# Patient Record
Sex: Male | Born: 1980 | Race: Black or African American | Hispanic: No | Marital: Single | Smoking: Current every day smoker
Health system: Southern US, Community
[De-identification: ages and names within clinical notes are randomized; demographics above are authoritative.]

---
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
## 2006-01-03 ENCOUNTER — Emergency Department (HOSPITAL_COMMUNITY): Admission: EM | Admit: 2006-01-03 | Discharge: 2006-01-03 | Payer: Self-pay | Admitting: Family Medicine

## 2007-06-18 ENCOUNTER — Emergency Department (HOSPITAL_COMMUNITY): Admission: EM | Admit: 2007-06-18 | Discharge: 2007-06-18 | Payer: Self-pay | Admitting: Family Medicine

## 2007-11-21 ENCOUNTER — Emergency Department (HOSPITAL_COMMUNITY): Admission: EM | Admit: 2007-11-21 | Discharge: 2007-11-21 | Payer: Self-pay | Admitting: Emergency Medicine

## 2008-06-03 ENCOUNTER — Emergency Department (HOSPITAL_COMMUNITY): Admission: EM | Admit: 2008-06-03 | Discharge: 2008-06-03 | Payer: Self-pay | Admitting: *Deleted

## 2008-06-19 ENCOUNTER — Emergency Department (HOSPITAL_COMMUNITY): Admission: EM | Admit: 2008-06-19 | Discharge: 2008-06-19 | Payer: Self-pay | Admitting: Emergency Medicine

## 2009-04-29 ENCOUNTER — Emergency Department (HOSPITAL_COMMUNITY): Admission: EM | Admit: 2009-04-29 | Discharge: 2009-04-29 | Payer: Self-pay | Admitting: Emergency Medicine

## 2009-08-10 ENCOUNTER — Emergency Department (HOSPITAL_COMMUNITY): Admission: EM | Admit: 2009-08-10 | Discharge: 2009-08-10 | Payer: Self-pay | Admitting: Family Medicine

## 2009-09-29 ENCOUNTER — Emergency Department (HOSPITAL_COMMUNITY): Admission: EM | Admit: 2009-09-29 | Discharge: 2009-09-30 | Payer: Self-pay | Admitting: Internal Medicine

## 2010-02-28 ENCOUNTER — Emergency Department (HOSPITAL_COMMUNITY): Admission: EM | Admit: 2010-02-28 | Discharge: 2010-02-28 | Payer: Self-pay | Admitting: Family Medicine

## 2010-09-04 ENCOUNTER — Emergency Department (HOSPITAL_COMMUNITY)
Admission: EM | Admit: 2010-09-04 | Discharge: 2010-09-04 | Disposition: A | Discharge status: Home / Self Care | Payer: Self-pay | Attending: Emergency Medicine | Admitting: Emergency Medicine

## 2010-09-04 DIAGNOSIS — IMO0002 Reserved for concepts with insufficient information to code with codable children: Secondary | ICD-10-CM | POA: Insufficient documentation

## 2010-09-04 DIAGNOSIS — M7989 Other specified soft tissue disorders: Secondary | ICD-10-CM | POA: Insufficient documentation

## 2011-05-08 LAB — POCT CARDIAC MARKERS
CKMB, poc: 3.6
Myoglobin, poc: 179
Operator id: 270111
Troponin i, poc: <0.05

## 2011-05-08 LAB — CBC
HCT: 49
Hemoglobin: 16.1
MCHC: 32.8
MCV: 87.3
Platelets: 267
RBC: 5.61
RDW: 12.8
WBC: 10.6 — ABNORMAL HIGH

## 2011-05-08 LAB — DIFFERENTIAL
Basophils Relative: 1
Lymphocytes Relative: 7 — ABNORMAL LOW
Lymphs Abs: 0.7
Monocytes Absolute: 0.5
Monocytes Relative: 4
Neutro Abs: 9.4 — ABNORMAL HIGH

## 2011-05-08 LAB — COMPREHENSIVE METABOLIC PANEL
Albumin: 4.3
Alkaline Phosphatase: 88
BUN: 13
Potassium: 3.9
Total Protein: 7.7

## 2011-11-11 ENCOUNTER — Encounter (HOSPITAL_COMMUNITY): Payer: Self-pay | Admitting: *Deleted

## 2011-11-11 ENCOUNTER — Emergency Department (HOSPITAL_COMMUNITY)
Admission: EM | Admit: 2011-11-11 | Discharge: 2011-11-11 | Disposition: A | Discharge status: Home / Self Care | Payer: Self-pay | Attending: Emergency Medicine | Admitting: Emergency Medicine

## 2011-11-11 DIAGNOSIS — F17200 Nicotine dependence, unspecified, uncomplicated: Secondary | ICD-10-CM | POA: Insufficient documentation

## 2011-11-11 DIAGNOSIS — G8929 Other chronic pain: Secondary | ICD-10-CM

## 2011-11-11 DIAGNOSIS — K089 Disorder of teeth and supporting structures, unspecified: Secondary | ICD-10-CM | POA: Insufficient documentation

## 2011-11-11 MED ORDER — OXYCODONE-ACETAMINOPHEN 5-325 MG PO TABS: 1.0000 | ORAL_TABLET | Freq: Four times a day (QID) | ORAL | Status: DC | PRN

## 2011-11-11 MED ORDER — PENICILLIN V POTASSIUM 500 MG PO TABS: 500.0000 mg | ORAL_TABLET | Freq: Four times a day (QID) | ORAL | Status: DC

## 2011-11-11 MED ORDER — OXYCODONE-ACETAMINOPHEN 5-325 MG PO TABS
1.0000 | ORAL_TABLET | Freq: Once | ORAL | Status: AC
  Administered 2011-11-11: 1 via ORAL
  Filled 2011-11-11: qty 1

## 2011-11-11 NOTE — ED Notes (Signed)
Patient has had right sided tooth ache for 3 weeks. Pt states that he had a left over antibiotic that he took for same.  Pt states that he has had increase in pain and now has swelling. PT states that he is supposed to have teeth removed.

## 2011-11-11 NOTE — ED Provider Notes (Signed)
Medical screening examination/treatment/procedure(s) were performed by non-physician practitioner and as supervising physician I was immediately available for consultation/collaboration.   Vida Roller, MD 11/11/11 [phone]

## 2011-11-11 NOTE — Discharge Instructions (Signed)
You have a dental injury. Use the resource guide listed below to help you find a dentist if you do not already have one to followup with. It is very important that you get evaluated by a dentist as soon as possible. Call tomorrow to schedule an appointment. Use your pain medication as prescribed and do not operate heavy machinery while on pain medication. Note that your pain medication contains acetaminophen (Tylenol) & its is not reccommended that you use additional acetaminophen (Tylenol) while taking this medication. Take your full course of antibiotics. Read the instructions below.  Eat a soft or liquid diet and rinse your mouth out after meals with warm water. You should see a dentist or return here at once if you have increased swelling, increased pain or uncontrolled bleeding from the site of your injury.   SEEK MEDICAL CARE IF:   You have increased pain not controlled with medicines.   You have swelling around your tooth, in your face or neck.   You have bleeding which starts, continues, or gets worse.   You have a fever >101  If you are unable to open your mouth  RESOURCE GUIDE  Chronic Pain Chronic pain can be defined as pain that is lasting, off and on, and lasts for 3 to 6 months or longer. Many things cause chronic pain, which can make it difficult to make a discrete diagnosis. There are many treatment options available for chronic pain. However, finding a treatment that works well for you may require trying various approaches until a suitable one is found. CAUSES  In some types of chronic medical conditions, the pain is caused by a normal pain response within the body. A normal pain response helps the body identify illness or injury and prevent further damage from being done. In these cases, the cause of the pain may be identified and treated, even if it may not be cured completely. Examples of chronic conditions which can cause chronic pain include:  Inflammation of the joints  (arthritis).   Back pain or neck pain (including bulging or herniated disks).   Migraine headaches.   Cancer.  In some other types of chronic pain syndromes, the pain is caused by an abnormal pain response within the body. An abnormal pain response is present when there is no ongoing cause (or stimulus) for the pain, or when the cause of the pain is arising from the nerves or nervous system itself. Examples of conditions which can cause chronic pain due to an abnormal pain response include:  Fibromyalgia.   Reflex sympathetic dystrophy (RSD).   Neuropathy (when the nerves themselves are damaged, and may cause pain).  DIAGNOSIS  Your caregiver will help diagnose your condition over time. In many cases, the initial focus will be on excluding conditions that could be causing the pain. Depending on your symptoms, your caregiver may order some tests to diagnose your condition. Some of these tests include:  Blood tests.   Computerized X-ray scans (CT scan).   Computerized magnetic scans (MRI).   X-rays.   Ultrasounds.   Nerve conduction studies.   Consultation with other physicians or specialists.  TREATMENT  There are many treatment options for people suffering from chronic pain. Finding a treatment that works well may take time.   You may be referred to a pain management specialist.   You may be put on medication to help with the pain. Unfortunately, some medications (such as opiate medications) may not be very effective in cases where chronic  pain is due to abnormal pain responses. Finding the right medications can take some time.   Adjunctive therapies may be used to provide additional relief and improve a patient's quality of life. These therapies include:   Mindfulness meditation.   Acupuncture.   Biofeedback.   Cognitive-behavioral therapy.   In certain cases, surgical interventions may be attempted.  HOME CARE INSTRUCTIONS   Make sure you understand these  instructions prior to discharge.   Ask any questions and share any further concerns you have with your caregiver prior to discharge.   Take all medications as directed by your caregiver.   Keep all follow-up appointments.  SEEK MEDICAL CARE IF:   Your pain gets worse.   You develop a new pain that was not present before.   You cannot tolerate any medications prescribed by your caregiver.   You develop new symptoms since your last visit with your caregiver.  SEEK IMMEDIATE MEDICAL CARE IF:   You develop muscular weakness.   You have decreased sensation or numbness.   You lose control of bowel or bladder function.   Your pain suddenly gets much worse.   You have an oral temperature above 102 F (38.9 C), not controlled by medication.   You develop shaking chills, confusion, chest pain, or shortness of breath.  Document Released: 04/07/2002 Document Revised: 07/05/2011 Document Reviewed: 07/14/2008 [facility] Patient Information 2012 [place], [place].  Dental Problems  Patients with Medicaid: [facility] [phone] [address]                                           [phone] [address] Phone:  [phone]                                                  Phone:  [phone]  If unable to pay or uninsured, contact:  [facility] or [facility]. to become qualified for the adult dental clinic.  Chronic Pain Problems Contact [facility]  [phone] Patients need to be referred by their primary care doctor.  Insufficient Money for Medicine Contact [employer]:  call "211" or [facility] 4425812676.  No Primary Care Doctor Call [facility]  [phone] Other agencies that provide inexpensive medical care    [facility]  [phone]    [facility]  [phone]    [facility]  [phone]    [facility]  [phone]    [facility]  [phone]     [facility]  [phone]  Psychological Services [facility] Behavioral Health  [phone] [facility] Services  [phone] [facility]   [phone] (emergency services [phone])  Substance Abuse Resources [facility]  [phone] [facility] 703-637-8595 [facility] [place] [phone] Floydene Flock [phone] Residential & Outpatient Substance Abuse Program  4182663301  Abuse/Neglect [facility] Child Abuse Hotline [phone] [facility] Child Abuse Hotline [phone] (After Hours)  Emergency Shelter [facility] [phone]  M[facility] [place] [facility] 4086318785 [facility] Services 4035820286  MRSA Hotline #:   (346) 555-2285    [facility] Resources  [facility] [place]     [employer]                          [facility] Dept. [address] [place]                       [address]      [address]  Blondell Reveal Phone:  [phone]                                   Phone:  [phone]                 Phone:  [phone]  [facility] Phone:  [phone]  [facility] Child Abuse Hotline [phone] (After Hours)

## 2011-11-11 NOTE — ED Provider Notes (Signed)
History     CSN: [id number]  Arrival date & time 11/11/11  4098   First MD Initiated Contact with Patient 11/11/11 [phone]      Chief Complaint  Patient presents with  . Dental Pain    (Consider location/radiation/quality/duration/timing/severity/associated sxs/prior treatment) HPI Comments: Patient presents to the emergency department with a dental complaint. Symptoms began 2-3 weeks ago. The patient has tried to alleviate pain with NSAIDS.  Pain rated at a 10/10, characterized as throbbing in nature and located upper right mouth. Patient denies fever, night sweats, chills, difficulty swallowing or opening mouth, SOB, nuchal rigidity or decreased ROM of neck.  Patient does not have a dentist and requests a resource guide at discharge.   Patient is a 31 y.o. male presenting with tooth pain. The history is provided by the patient.  Dental PainThe primary symptoms include headaches. Primary symptoms do not include fever, shortness of breath, sore throat or cough.  The headache is not associated with photophobia, eye pain, neck stiffness or weakness.  Additional symptoms do not include: facial swelling, trouble swallowing, drooling, ear pain and fatigue.    History reviewed. No pertinent past medical history.  History reviewed. No pertinent past surgical history.  History reviewed. No pertinent family history.  History  Substance Use Topics  . Smoking status: Current Everyday Smoker    Types: Cigars  . Smokeless tobacco: Not on file  . Alcohol Use: Yes      Review of Systems  Constitutional: Negative for fever, chills, diaphoresis, activity change and fatigue.  HENT: Positive for dental problem. Negative for ear pain, congestion, sore throat, facial swelling, drooling, mouth sores, trouble swallowing, neck pain, neck stiffness, voice change, sinus pressure and tinnitus.   Eyes: Negative for photophobia, pain, redness and visual disturbance.  Respiratory: Negative for cough,  shortness of breath, wheezing and stridor.   Cardiovascular: Negative for chest pain.  Gastrointestinal: Negative for nausea, vomiting and abdominal pain.  Musculoskeletal: Negative for myalgias and gait problem.  Skin: Negative for rash.  Neurological: Positive for headaches. Negative for dizziness, syncope, speech difficulty, weakness, light-headedness and numbness.       No bowel or bladder incontinence.  Hematological: Negative for adenopathy.  Psychiatric/Behavioral: Negative for confusion.  All other systems reviewed and are negative.    Allergies  Review of patient's allergies indicates no known allergies.  Home Medications  No current outpatient prescriptions on file.  BP 139/98  Pulse 78  Temp(Src) 97.8 F (36.6 C) (Oral)  Resp 20  SpO2 95%  Physical Exam  Nursing note and vitals reviewed. Constitutional: He is oriented to person, place, and time. He appears well-developed and well-nourished. No distress.  HENT:  Head: Normocephalic and atraumatic. No trismus in the jaw.  Mouth/Throat: Uvula is midline, oropharynx is clear and moist and mucous membranes are normal. Abnormal dentition. No dental abscesses or uvula swelling. No oropharyngeal exudate, posterior oropharyngeal edema, posterior oropharyngeal erythema or tonsillar abscesses.       Poor dental hygiene. Pt able to open and close mouth with out difficulty. Airway intact. Uvula midline. Mild gingival swelling with tenderness over affected area, but no fluctuance. No swelling or tenderness of submental and submandibular regions.  Eyes: Conjunctivae and EOM are normal.  Neck: Normal range of motion and full passive range of motion without pain. Neck supple.  Cardiovascular: Normal rate and regular rhythm.   Pulmonary/Chest: Effort normal and breath sounds normal. No stridor. No respiratory distress. He has no wheezes.  Musculoskeletal: Normal range of  motion.  Lymphadenopathy:       Head (right side): No submental,  no submandibular, no tonsillar, no preauricular and no posterior auricular adenopathy present.       Head (left side): No submental, no submandibular, no tonsillar, no preauricular and no posterior auricular adenopathy present.    He has no cervical adenopathy.  Neurological: He is alert and oriented to person, place, and time.  Skin: Skin is warm and dry. No rash noted. He is not diaphoretic.    ED Course  Procedures (including critical care time)  Labs Reviewed - No data to display No results found.   No diagnosis found.    MDM  Dental pain  Patient with toothache.  No gross abscess.  Exam unconcerning for Ludwig's angina or spread of infection.  Will treat with penicillin and pain medicine.  Urged patient to follow-up with dentist. Pt was given 1 percocet here and 12 to go home.          Jaci Carrel, PA-C 11/11/11 0631  Jaci Carrel, PA-C 11/11/11 [phone]

## 2011-11-13 ENCOUNTER — Encounter (HOSPITAL_COMMUNITY): Payer: Self-pay | Admitting: Emergency Medicine

## 2011-11-13 ENCOUNTER — Emergency Department (HOSPITAL_COMMUNITY)
Admission: EM | Admit: 2011-11-13 | Discharge: 2011-11-14 | Disposition: A | Discharge status: Home / Self Care | Payer: Self-pay | Attending: Emergency Medicine | Admitting: Emergency Medicine

## 2011-11-13 DIAGNOSIS — K029 Dental caries, unspecified: Secondary | ICD-10-CM | POA: Insufficient documentation

## 2011-11-13 DIAGNOSIS — K0889 Other specified disorders of teeth and supporting structures: Secondary | ICD-10-CM

## 2011-11-13 DIAGNOSIS — F17200 Nicotine dependence, unspecified, uncomplicated: Secondary | ICD-10-CM | POA: Insufficient documentation

## 2011-11-13 NOTE — ED Notes (Signed)
PT. REPORTS PERSISTENT RIGHT UPPER MOLAR PAIN  FOR 4 DAYS , SEEN HERE LAST Sunday PRESCRIBED WITH PERCOCET AND PENICILLIN WITH NO RELIEF.

## 2011-11-14 MED ORDER — OXYCODONE-ACETAMINOPHEN 5-325 MG PO TABS: 2.0000 | ORAL_TABLET | ORAL | Status: AC | PRN

## 2011-11-14 NOTE — ED Notes (Signed)
Patient with pain in upper right jaw, some swelling under right eye noted.  Pain when swallowing and right ear pain.

## 2011-11-14 NOTE — ED Provider Notes (Signed)
History     CSN: [id number]  Arrival date & time 11/13/11  2216   First MD Initiated Contact with Patient 11/14/11 0021      Chief Complaint  Patient presents with  . Dental Pain    (Consider location/radiation/quality/duration/timing/severity/associated sxs/prior treatment) HPI History provided by the patient. Right upper dental pain persistent for the last 4 days. Patient was seen here 2 days ago and given prescription for Percocet and penicillin. He did call a dentist and is scheduled followup in the morning. He has ran out of pain medicines and continues to have severe pain. No fevers or chills. No nausea or vomiting. No difficulty swallowing or breathing. He does feel a small lump on the side of his neck became worried about that as well. No drainage. No facial swelling. No difficulty opening his mouth. Pain is sharp in quality and not radiating. Moderate to severe. History reviewed. No pertinent past medical history.  History reviewed. No pertinent past surgical history.  No family history on file.  History  Substance Use Topics  . Smoking status: Current Everyday Smoker    Types: Cigars  . Smokeless tobacco: Not on file  . Alcohol Use: Yes      Review of Systems  Constitutional: Negative for fever and chills.  HENT: Positive for dental problem. Negative for neck pain and neck stiffness.   Eyes: Negative for pain.  Respiratory: Negative for shortness of breath.   Cardiovascular: Negative for chest pain.  Gastrointestinal: Negative for abdominal pain.  Genitourinary: Negative for flank pain.  Musculoskeletal: Negative for back pain.  Skin: Negative for rash.  Neurological: Negative for headaches.  All other systems reviewed and are negative.    Allergies  Review of patient's allergies indicates no known allergies.  Home Medications   Current Outpatient Rx  Name Route Sig Dispense Refill  . ACETAMINOPHEN 325 MG PO TABS Oral Take 650 mg by mouth every 6 (six)  hours as needed. For tooth pain    . OXYCODONE-ACETAMINOPHEN 5-325 MG PO TABS Oral Take 1 tablet by mouth every 6 (six) hours as needed. For pain    . PENICILLIN V POTASSIUM 500 MG PO TABS Oral Take 500 mg by mouth 4 (four) times daily.      BP 150/100  Pulse 80  Temp(Src) 98.4 F (36.9 C) (Oral)  Resp 20  SpO2 95%  Physical Exam  Constitutional: He is oriented to person, place, and time. He appears well-developed and well-nourished.  HENT:  Head: Normocephalic and atraumatic.       No trismus. Tender right upper second molar. Multiple scattered caries throughout. No gingival fluctuance or drainage. No associated facial swelling or erythema. TMs clear. Uvula midline. No oral lesions.  Eyes: Conjunctivae and EOM are normal. Pupils are equal, round, and reactive to light.  Neck: Trachea normal. Neck supple. No thyromegaly present.  Cardiovascular: Normal rate, regular rhythm, S1 normal, S2 normal and normal pulses.     No systolic murmur is present   No diastolic murmur is present  Pulses:      Radial pulses are 2+ on the right side, and 2+ on the left side.  Pulmonary/Chest: Effort normal and breath sounds normal. He has no wheezes. He has no rhonchi. He has no rales. He exhibits no tenderness.  Abdominal: Normal appearance. There is no CVA tenderness and negative Murphy's sign.  Musculoskeletal:       BLE:s Calves nontender, no cords or erythema, negative Homans sign  Neurological: He is alert  and oriented to person, place, and time. He has normal strength. No cranial nerve deficit or sensory deficit. GCS eye subscore is 4. GCS verbal subscore is 5. GCS motor subscore is 6.  Skin: Skin is warm and dry. No rash noted. He is not diaphoretic.  Psychiatric: His speech is normal.       Cooperative and appropriate    ED Course  Dental Date/Time: 11/14/2011 12:59 AM Performed by: Sunnie Nielsen Authorized by: Sunnie Nielsen Consent: Verbal consent obtained. Risks and benefits: risks,  benefits and alternatives were discussed Consent given by: patient Patient understanding: patient states understanding of the procedure being performed Patient consent: the patient's understanding of the procedure matches consent given Procedure consent: procedure consent matches procedure scheduled Required items: required blood products, implants, devices, and special equipment available Patient identity confirmed: verbally with patient Time out: Immediately prior to procedure a "time out" was called to verify the correct patient, procedure, equipment, support staff and site/side marked as required. Preparation: Patient was prepped and draped in the usual sterile fashion. Local anesthesia used: yes Anesthesia: local infiltration Local anesthetic: lidocaine 1% without epinephrine Anesthetic total: 2 ml Patient tolerance: Patient tolerated the procedure well with no immediate complications.   (including critical care time)     MDM   Dental pain possible abscess with persistent symptoms, has only had about 48 hours of antibiotics. Dental block as above with good pain control. Patient scheduled to see Dr. Devonne Doughty in the morning in the dental clinic.  Plan continue antibiotics, keep scheduled appointment and prescription for 6 Percocet provided for any persistent pain. Stable for discharge home        Sunnie Nielsen, MD 11/14/11 0100

## 2011-11-14 NOTE — Discharge Instructions (Signed)
Dental Pain A tooth ache may be caused by cavities (tooth decay). Cavities expose the nerve of the tooth to air and hot or cold temperatures. It may come from an infection or abscess (also called a boil or furuncle) around your tooth. It is also often caused by dental caries (tooth decay). This causes the pain you are having. DIAGNOSIS  Your caregiver can diagnose this problem by exam. TREATMENT   If caused by an infection, it may be treated with medications which kill germs (antibiotics) and pain medications as prescribed by your caregiver. Take medications as directed.   Only take over-the-counter or prescription medicines for pain, discomfort, or fever as directed by your caregiver.   Whether the tooth ache today is caused by infection or dental disease, you should see your dentist as soon as possible for further care.  SEEK MEDICAL CARE IF: The exam and treatment you received today has been provided on an emergency basis only. This is not a substitute for complete medical or dental care. If your problem worsens or new problems (symptoms) appear, and you are unable to meet with your dentist, call or return to this location. SEEK IMMEDIATE MEDICAL CARE IF:   You have a fever.   You develop redness and swelling of your face, jaw, or neck.   You are unable to open your mouth.   You have severe pain uncontrolled by pain medicine.  MAKE SURE YOU:   Understand these instructions.   Will watch your condition.   Will get help right away if you are not doing well or get worse.    Dental Care and Dentist Visits Dental care supports good overall health. Regular dental visits can also help you avoid dental pain, bleeding, infection, and other more serious health problems in the future. It is important to keep the mouth healthy because diseases in the teeth, gums, and other oral tissues can spread to other areas of the body. Some problems, such as diabetes, heart disease, and pre-term labor  have been associated with poor oral health.  See your dentist every 6 months. If you experience emergency problems such as a toothache or broken tooth, go to the dentist right away. If you see your dentist regularly, you may catch problems early. It is easier to be treated for problems in the early stages.  WHAT TO EXPECT AT A DENTIST VISIT  Your dentist will look for many common oral health problems and recommend proper treatment. At your regular dental visit, you can expect:  Gentle cleaning of the teeth and gums. This includes scraping and polishing. This helps to remove the sticky substance around the teeth and gums (plaque). Plaque forms in the mouth shortly after eating. Over time, plaque hardens on the teeth as tartar. If tartar is not removed regularly, it can cause problems. Cleaning also helps remove stains.   Periodic X-rays. These pictures of the teeth and supporting bone will help your dentist assess the health of your teeth.   Periodic fluoride treatments. Fluoride is a natural mineral shown to help strengthen teeth. Fluoride treatmentinvolves applying a fluoride gel or varnish to the teeth. It is most commonly done in children.   Examination of the mouth, tongue, jaws, teeth, and gums to look for any oral health problems, such as:   Cavities (dental caries). This is decay on the tooth caused by plaque, sugar, and acid in the mouth. It is best to catch a cavity when it is small.   Inflammation of the  gums caused by plaque buildup (gingivitis).   Problems with the mouth or malformed or misaligned teeth.   Oral cancer or other diseases of the soft tissues or jaws.  KEEP YOUR TEETH AND GUMS HEALTHY For healthy teeth and gums, follow these general guidelines as well as your dentist's specific advice:  Have your teeth professionally cleaned at the dentist every 6 months.   Brush twice daily with a fluoride toothpaste.   Floss your teeth daily.   Ask your dentist if you need  fluoride supplements, treatments, or fluoride toothpaste.   Eat a healthy diet. Reduce foods and drinks with added sugar.   Avoid smoking.  TREATMENT FOR ORAL HEALTH PROBLEMS If you have oral health problems, treatment varies depending on the conditions present in your teeth and gums.  Your caregiver will most likely recommend good oral hygiene at each visit.   For cavities, gingivitis, or other oral health disease, your caregiver will perform a procedure to treat the problem. This is typically done at a separate appointment. Sometimes your caregiver will refer you to another dental specialist for specific tooth problems or for surgery.  SEEK IMMEDIATE DENTAL CARE IF:  You have pain, bleeding, or soreness in the gum, tooth, jaw, or mouth area.   A permanent tooth becomes loose or separated from the gum socket.   You experience a blow or injury to the mouth or jaw area.

## 2013-10-05 ENCOUNTER — Emergency Department (INDEPENDENT_AMBULATORY_CARE_PROVIDER_SITE_OTHER)
Admission: EM | Admit: 2013-10-05 | Discharge: 2013-10-05 | Disposition: A | Discharge status: Home / Self Care | Payer: Self-pay | Source: Home / Self Care | Attending: Family Medicine | Admitting: Family Medicine

## 2013-10-05 ENCOUNTER — Encounter (HOSPITAL_COMMUNITY): Payer: Self-pay | Admitting: Emergency Medicine

## 2013-10-05 DIAGNOSIS — R21 Rash and other nonspecific skin eruption: Secondary | ICD-10-CM

## 2013-10-05 DIAGNOSIS — B354 Tinea corporis: Secondary | ICD-10-CM

## 2013-10-05 MED ORDER — CEPHALEXIN 500 MG PO CAPS: 1000.0000 mg | ORAL_CAPSULE | Freq: Three times a day (TID) | ORAL | Status: DC

## 2013-10-05 MED ORDER — TERBINAFINE HCL 250 MG PO TABS: 250.0000 mg | ORAL_TABLET | Freq: Every day | ORAL | Status: DC

## 2013-10-05 NOTE — ED Provider Notes (Signed)
Medical screening examination/treatment/procedure(s) were performed by resident physician or non-physician practitioner and as supervising physician I was immediately available for consultation/collaboration.   Lirio Bach DOUGLAS MD.   Ryka Beighley D Danean Marner, MD 10/05/13 2056 

## 2013-10-05 NOTE — ED Provider Notes (Signed)
CSN: [id number]     Arrival date & time 10/05/13  1400 History   First MD Initiated Contact with Patient 10/05/13 1556     Chief Complaint  Patient presents with  . Rash   (Consider location/radiation/quality/duration/timing/severity/associated sxs/prior Treatment) HPI Comments: 33 year old male presents complaining of a rash for many weeks. This started on his right hip and has since spread to his left thigh and right shoulder. He assumes that it was ringworm and was putting Tinactin and Lotrimin on it consistently, these have not helped. The rash on his left thigh has started to crust and scab over. He denies any symptoms apart from the rash. He has no fever, chills, NVD, chest pain, shortness of breath. He does not feel sick. He has no close contact with anybody else a similar rash that he knows of.  Patient is a 33 y.o. male presenting with rash.  Rash Associated symptoms: no abdominal pain, no diarrhea, no fatigue, no fever, no joint pain, no myalgias, no nausea, no shortness of breath, no sore throat and not vomiting     History reviewed. No pertinent past medical history. History reviewed. No pertinent past surgical history. No family history on file. History  Substance Use Topics  . Smoking status: Current Every Day Smoker    Types: Cigars  . Smokeless tobacco: Not on file  . Alcohol Use: Yes    Review of Systems  Constitutional: Negative for fever, chills and fatigue.  HENT: Negative for sore throat.   Eyes: Negative for visual disturbance.  Respiratory: Negative for cough and shortness of breath.   Cardiovascular: Negative for chest pain, palpitations and leg swelling.  Gastrointestinal: Negative for nausea, vomiting, abdominal pain, diarrhea and constipation.  Genitourinary: Negative for dysuria, urgency, frequency and hematuria.  Musculoskeletal: Negative for arthralgias, myalgias, neck pain and neck stiffness.  Skin: Positive for rash.  Neurological: Negative for  dizziness, weakness and light-headedness.    Allergies  Review of patient's allergies indicates no known allergies.  Home Medications   Current Outpatient Rx  Name  Route  Sig  Dispense  Refill  . clotrimazole (LOTRIMIN) 1 % cream   Topical   Apply 1 application topically 2 (two) times daily.         . Tolnaftate (TINACTIN EX)   Apply externally   Apply topically.         Marland Kitchen acetaminophen (TYLENOL) 325 MG tablet   Oral   Take 650 mg by mouth every 6 (six) hours as needed. For tooth pain         . cephALEXin (KEFLEX) 500 MG capsule   Oral   Take 2 capsules (1,000 mg total) by mouth 3 (three) times daily.   42 capsule   0   . terbinafine (LAMISIL) 250 MG tablet   Oral   Take 1 tablet (250 mg total) by mouth daily.   14 tablet   0    BP 132/83  Pulse 58  Temp(Src) 98.1 F (36.7 C) (Oral)  Resp 18  SpO2 99% Physical Exam  Nursing note and vitals reviewed. Constitutional: He is oriented to person, place, and time. He appears well-developed and well-nourished. No distress.  HENT:  Head: Normocephalic.  Pulmonary/Chest: Effort normal. No respiratory distress.  Neurological: He is alert and oriented to person, place, and time. Coordination normal.  Skin: Skin is warm and dry. Rash (left lateral thigh: Circumscribed scabbed indurated rash. Left upper back and left hip: Circumscribed rash with a raised rolled border and central  clearing, with a slight scale) noted. He is not diaphoretic.  Psychiatric: He has a normal mood and affect. Judgment normal.    ED Course  Procedures (including critical care time) Labs Review Labs Reviewed - No data to display Imaging Review No results found.   MDM   1. Tinea corporis   2. Rash    I believe this patient has tinea corporis, but the rash on the left leg appears secondarily infected. He has been using topical products and has had treatment failure, treating with oral Lamisil for 2 weeks once daily. We'll treat these  secondarily infected rash with Keflex. Followup if not improving  Meds ordered this encounter  Medications  . Tolnaftate (TINACTIN EX)    Sig: Apply topically.  . clotrimazole (LOTRIMIN) 1 % cream    Sig: Apply 1 application topically 2 (two) times daily.  Marland Kitchen. terbinafine (LAMISIL) 250 MG tablet    Sig: Take 1 tablet (250 mg total) by mouth daily.    Dispense:  14 tablet    Refill:  0    Order Specific Question:  Supervising Provider    Answer:  Lorenz CoasterKELLER, DAVID C [id number]]  . cephALEXin (KEFLEX) 500 MG capsule    Sig: Take 2 capsules (1,000 mg total) by mouth 3 (three) times daily.    Dispense:  42 capsule    Refill:  0    Order Specific Question:  Supervising Provider    Answer:  Lorenz CoasterKELLER, DAVID C [[id number]]     Graylon GoodZachary H Zona Pedro, PA-C 10/05/13 1616

## 2013-10-05 NOTE — ED Notes (Signed)
Call from [facility], problem w [id number] , asking for telephone confirmation of rx

## 2013-10-05 NOTE — Discharge Instructions (Signed)
Body Ringworm Ringworm (tinea corporis) is a fungal infection of the skin on the body. This infection is not caused by worms, but is actually caused by a fungus. Fungus normally lives on the top of your skin and can be useful. However, in the case of ringworms, the fungus grows out of control and causes a skin infection. It can involve any area of skin on the body and can spread easily from one person to another (contagious). Ringworm is a common problem for children, but it can affect adults as well. Ringworm is also often found in athletes, especially wrestlers who share equipment and mats.  CAUSES  Ringworm of the body is caused by a fungus called dermatophyte. It can spread by:  Touchingother people who are infected.  Touchinginfected pets.  Touching or sharingobjects that have been in contact with the infected person or pet (hats, combs, towels, clothing, sports equipment). SYMPTOMS   Itchy, raised red spots and bumps on the skin.  Ring-shaped rash.  Redness near the border of the rash with a clear center.  Dry and scaly skin on or around the rash. Not every person develops a ring-shaped rash. Some develop only the red, scaly patches. DIAGNOSIS  Most often, ringworm can be diagnosed by performing a skin exam. Your caregiver may choose to take a skin scraping from the affected area. The sample will be examined under the microscope to see if the fungus is present.  TREATMENT  Body ringworm may be treated with a topical antifungal cream or ointment. Sometimes, an antifungal shampoo that can be used on your body is prescribed. You may be prescribed antifungal medicines to take by mouth if your ringworm is severe, keeps coming back, or lasts a long time.  HOME CARE INSTRUCTIONS   Only take over-the-counter or prescription medicines as directed by your caregiver.  Wash the infected area and dry it completely before applying yourcream or ointment.  When using antifungal shampoo to  treat the ringworm, leave the shampoo on the body for 3 5 minutes before rinsing.   Wear loose clothing to stop clothes from rubbing and irritating the rash.  Wash or change your bed sheets every night while you have the rash.  Have your pet treated by your veterinarian if it has the same infection. To prevent ringworm:   Practice good hygiene.  Wear sandals or shoes in public places and showers.  Do not share personal items with others.  Avoid touching red patches of skin on other people.  Avoid touching pets that have bald spots or wash your hands after doing so. SEEK MEDICAL CARE IF:   Your rash continues to spread after 7 days of treatment.  Your rash is not gone in 4 weeks.  The area around your rash becomes red, warm, tender, and swollen. Document Released: 07/13/2000 Document Revised: 04/09/2012 Document Reviewed: 01/28/2012 [facility] Patient Information 2014 [place], [place].  Rash A rash is a change in the color or texture of your skin. There are many different types of rashes. You may have other problems that accompany your rash. CAUSES   Infections.  Allergic reactions. This can include allergies to pets or foods.  Certain medicines.  Exposure to certain chemicals, soaps, or cosmetics.  Heat.  Exposure to poisonous plants.  Tumors, both cancerous and noncancerous. SYMPTOMS   Redness.  Scaly skin.  Itchy skin.  Dry or cracked skin.  Bumps.  Blisters.  Pain. DIAGNOSIS  Your caregiver may do a physical exam to determine what type  of rash you have. A skin sample (biopsy) may be taken and examined under a microscope. TREATMENT  Treatment depends on the type of rash you have. Your caregiver may prescribe certain medicines. For serious conditions, you may need to see a skin doctor (dermatologist). HOME CARE INSTRUCTIONS   Avoid the substance that caused your rash.  Do not scratch your rash. This can cause infection.  You may take cool baths to  help stop itching.  Only take over-the-counter or prescription medicines as directed by your caregiver.  Keep all follow-up appointments as directed by your caregiver. SEEK IMMEDIATE MEDICAL CARE IF:  You have increasing pain, swelling, or redness.  You have a fever.  You have new or severe symptoms.  You have body aches, diarrhea, or vomiting.  Your rash is not better after 3 days. MAKE SURE YOU:  Understand these instructions.  Will watch your condition.  Will get help right away if you are not doing well or get worse. Document Released: 07/06/2002 Document Revised: 10/08/2011 Document Reviewed: 04/30/2011 [facility] Patient Information 2014 [place].

## 2013-10-05 NOTE — ED Notes (Signed)
Patient has a flat, circular, scabbed area to left thigh.  Area itches.  When asked how it looked when it started, patient showed another area, his right hip, with similar area.  Right hip is flat, dry, scaly area.  Onset "weeks ago" per patient.  Has been using Lotrimin and tinactin cream.

## 2014-12-29 ENCOUNTER — Emergency Department (HOSPITAL_COMMUNITY): Payer: Self-pay

## 2014-12-29 ENCOUNTER — Encounter (HOSPITAL_COMMUNITY): Payer: Self-pay | Admitting: Emergency Medicine

## 2014-12-29 ENCOUNTER — Emergency Department (HOSPITAL_COMMUNITY)
Admission: EM | Admit: 2014-12-29 | Discharge: 2014-12-29 | Disposition: A | Discharge status: Home / Self Care | Payer: Self-pay | Attending: Emergency Medicine | Admitting: Emergency Medicine

## 2014-12-29 DIAGNOSIS — Y92310 Basketball court as the place of occurrence of the external cause: Secondary | ICD-10-CM | POA: Insufficient documentation

## 2014-12-29 DIAGNOSIS — Y9367 Activity, basketball: Secondary | ICD-10-CM | POA: Insufficient documentation

## 2014-12-29 DIAGNOSIS — Z720 Tobacco use: Secondary | ICD-10-CM | POA: Insufficient documentation

## 2014-12-29 DIAGNOSIS — Y998 Other external cause status: Secondary | ICD-10-CM | POA: Insufficient documentation

## 2014-12-29 DIAGNOSIS — X58XXXA Exposure to other specified factors, initial encounter: Secondary | ICD-10-CM | POA: Insufficient documentation

## 2014-12-29 DIAGNOSIS — S8391XA Sprain of unspecified site of right knee, initial encounter: Secondary | ICD-10-CM | POA: Insufficient documentation

## 2014-12-29 DIAGNOSIS — Z792 Long term (current) use of antibiotics: Secondary | ICD-10-CM | POA: Insufficient documentation

## 2014-12-29 DIAGNOSIS — Z79899 Other long term (current) drug therapy: Secondary | ICD-10-CM | POA: Insufficient documentation

## 2014-12-29 MED ORDER — IBUPROFEN 800 MG PO TABS: 800.0000 mg | ORAL_TABLET | Freq: Three times a day (TID) | ORAL | Status: DC

## 2014-12-29 NOTE — ED Provider Notes (Signed)
CSN: [id number]     Arrival date & time 12/29/14  0900 History  This chart was scribed for Trisha MangleKaren Sophia, PA-C, working with Gerhard Munchobert Lockwood, MD by Elon SpannerGarrett Cook, ED Scribe. This patient was seen in room TR05C/TR05C and the patient's care was started at 10:30 AM.    Chief Complaint  Patient presents with  . Knee Pain   The history is provided by the patient. No language interpreter was used.   HPI Comments: Chris Greene is a 34 y.o. male who presents to the Emergency Department complaining of lateral right knee pain onset two days ago with infrequent pain on the medial side with certain motions.  The patient was playing basketball 2 days ago when his knee buckled.  He continued playing but the pain worsened significantly that night.  He rates his pain with certain motions 8/10 and 0/10 with rest.  He denies history of knee conditions.  NKDA.  History reviewed. No pertinent past medical history. History reviewed. No pertinent past surgical history. No family history on file. History  Substance Use Topics  . Smoking status: Current Every Day Smoker    Types: Cigars  . Smokeless tobacco: Not on file  . Alcohol Use: Yes     Comment: socially    Review of Systems  Musculoskeletal: Positive for arthralgias.  All other systems reviewed and are negative.     Allergies  Review of patient's allergies indicates no known allergies.  Home Medications   Prior to Admission medications   Medication Sig Start Date End Date Taking? Authorizing Provider  acetaminophen (TYLENOL) 325 MG tablet Take 650 mg by mouth every 6 (six) hours as needed. For tooth pain    Historical Provider, MD  cephALEXin (KEFLEX) 500 MG capsule Take 2 capsules (1,000 mg total) by mouth 3 (three) times daily. 10/05/13   Graylon GoodZachary H Baker, PA-C  clotrimazole (LOTRIMIN) 1 % cream Apply 1 application topically 2 (two) times daily.    Historical Provider, MD  terbinafine (LAMISIL) 250 MG tablet Take 1 tablet (250 mg total) by  mouth daily. 10/05/13   Graylon GoodZachary H Baker, PA-C  Tolnaftate (TINACTIN EX) Apply topically.    Historical Provider, MD   BP 155/89 mmHg  Pulse 67  Temp(Src) 97.6 F (36.4 C) (Oral)  Resp 18  Ht 6\' 2"  (1.88 m)  Wt 280 lb (127.007 kg)  BMI 35.93 kg/m2  SpO2 100% Physical Exam  Constitutional: He is oriented to person, place, and time. He appears well-developed and well-nourished. No distress.  HENT:  Head: Normocephalic and atraumatic.  Eyes: Conjunctivae and EOM are normal.  Neck: Neck supple. No tracheal deviation present.  Cardiovascular: Normal rate.   Pulmonary/Chest: Effort normal. No respiratory distress.  Musculoskeletal: Normal range of motion.  No medial or lateral instability.  Negative drawer test.  Tender lateral mid joint line    Neurological: He is alert and oriented to person, place, and time.  Skin: Skin is warm and dry.  Psychiatric: He has a normal mood and affect. His behavior is normal.  Nursing note and vitals reviewed.   ED Course  Procedures (including critical care time)  DIAGNOSTIC STUDIES: Oxygen Saturation is 100% on RA, normal by my interpretation.    COORDINATION OF CARE:  10:35 AM Discussed treatment plan with patient at bedside.  Patient acknowledges and agrees with plan.    Labs Review Labs Reviewed - No data to display  Imaging Review No results found.   EKG Interpretation None  MDM knee immbolizer, ibuprofen Schedule to see Dr. Charlann Boxer for recheck     Final diagnoses:  Knee sprain, right, initial encounter     I personally performed the services in this documentation, which was scribed in my presence.  The recorded information has been reviewed and considered.   Barnet Pall.   I personally performed the services in this documentation, which was scribed in my presence.  The recorded information has been reviewed and considered.   Barnet Pall.   Lonia Skinner Stanhope, PA-C 12/29/14 1112  Gerhard Munch, MD 12/29/14 [phone]

## 2014-12-29 NOTE — Discharge Instructions (Signed)

## 2014-12-29 NOTE — ED Notes (Signed)
Patient stated someone had already taken 2nd set of vitals, and would not let me get another set.   [facility] in [place] states do not have second set of vitals.

## 2014-12-29 NOTE — ED Notes (Signed)
Patient states hurt R knee x 2 days ago after playing basketball and "knee buckled".   Patient states has not taken anything for pain at home.

## 2015-01-03 ENCOUNTER — Other Ambulatory Visit (HOSPITAL_COMMUNITY): Payer: Self-pay | Admitting: Sports Medicine

## 2015-01-03 DIAGNOSIS — M25561 Pain in right knee: Secondary | ICD-10-CM

## 2015-01-10 ENCOUNTER — Ambulatory Visit (HOSPITAL_COMMUNITY)
Admission: RE | Admit: 2015-01-10 | Discharge: 2015-01-10 | Disposition: A | Discharge status: Home / Self Care | Payer: Self-pay | Source: Ambulatory Visit | Attending: Sports Medicine | Admitting: Sports Medicine

## 2015-01-10 DIAGNOSIS — M25561 Pain in right knee: Secondary | ICD-10-CM | POA: Insufficient documentation

## 2015-01-10 DIAGNOSIS — M94261 Chondromalacia, right knee: Secondary | ICD-10-CM | POA: Insufficient documentation

## 2015-01-10 DIAGNOSIS — X58XXXA Exposure to other specified factors, initial encounter: Secondary | ICD-10-CM | POA: Insufficient documentation

## 2015-01-10 DIAGNOSIS — Y9367 Activity, basketball: Secondary | ICD-10-CM | POA: Insufficient documentation

## 2015-01-10 DIAGNOSIS — R937 Abnormal findings on diagnostic imaging of other parts of musculoskeletal system: Secondary | ICD-10-CM | POA: Insufficient documentation

## 2016-02-08 ENCOUNTER — Ambulatory Visit (HOSPITAL_COMMUNITY)
Admission: EM | Admit: 2016-02-08 | Discharge: 2016-02-08 | Disposition: A | Discharge status: Home / Self Care | Payer: Self-pay | Attending: Family Medicine | Admitting: Family Medicine

## 2016-02-08 ENCOUNTER — Encounter (HOSPITAL_COMMUNITY): Payer: Self-pay | Admitting: *Deleted

## 2016-02-08 ENCOUNTER — Ambulatory Visit (INDEPENDENT_AMBULATORY_CARE_PROVIDER_SITE_OTHER): Payer: Self-pay

## 2016-02-08 DIAGNOSIS — S8992XA Unspecified injury of left lower leg, initial encounter: Secondary | ICD-10-CM

## 2016-02-08 MED ORDER — OXYCODONE-ACETAMINOPHEN 7.5-325 MG PO TABS
1.0000 | ORAL_TABLET | ORAL | Status: DC | PRN
Start: 2016-02-08 — End: 2018-02-07

## 2016-02-08 NOTE — Discharge Instructions (Signed)
Cryotherapy °Cryotherapy is when you put ice on your injury. Ice helps lessen pain and puffiness (swelling) after an injury. Ice works the best when you start using it in the first 24 to 48 hours after an injury. °HOME CARE °· Put a dry or damp towel between the ice pack and your skin. °· You may press gently on the ice pack. °· Leave the ice on for no more than 10 to 20 minutes at a time. °· Check your skin after 5 minutes to make sure your skin is okay. °· Rest at least 20 minutes between ice pack uses. °· Stop using ice when your skin loses feeling (numbness). °· Do not use ice on someone who cannot tell you when it hurts. This includes small children and people with memory problems (dementia). °GET HELP RIGHT AWAY IF: °· You have white spots on your skin. °· Your skin turns blue or pale. °· Your skin feels waxy or hard. °· Your puffiness gets worse. °MAKE SURE YOU:  °· Understand these instructions. °· Will watch your condition. °· Will get help right away if you are not doing well or get worse. °  °This information is not intended to replace advice given to you by your health care provider. Make sure you discuss any questions you have with your health care provider. °  °Document Released: 01/02/2008 Document Revised: 10/08/2011 Document Reviewed: 03/08/2011 °Elsevier Interactive Patient Education ©2016 [employer] Inc[employer] ° °Knee Pain °Knee pain is a common problem. It can have many causes. The pain often goes away by following your doctor's home care instructions. Treatment for ongoing pain will depend on the cause of your pain. If your knee pain continues, more tests may be needed to diagnose your condition. Tests may include X-rays or other imaging studies of your knee. °HOME CARE °· Take medicines only as told by your doctor. °· Rest your knee and keep it raised (elevated) while you are resting. °· Do not do things that cause pain or make your pain worse. °· Avoid activities where both feet leave the ground at  the same time, such as running, jumping rope, or doing jumping jacks. °· Apply ice to the knee area: °¨ Put ice in a plastic bag. °¨ Place a towel between your skin and the bag. °¨ Leave the ice on for 20 minutes, 2-3 times a day. °· Ask your doctor if you should wear an elastic knee support. °· Sleep with a pillow under your knee. °· Lose weight if you are overweight. Being overweight can make your knee hurt more. °· Do not use any tobacco products, including cigarettes, chewing tobacco, or electronic cigarettes. If you need help quitting, ask your doctor. Smoking may slow the healing of any bone and joint problems that you may have. °GET HELP IF: °· Your knee pain does not stop, it changes, or it gets worse. °· You have a fever along with knee pain. °· Your knee gives out or locks up. °· Your knee becomes more swollen. °GET HELP RIGHT AWAY IF:  °· Your knee feels hot to the touch. °· You have chest pain or trouble breathing. °  °This information is not intended to replace advice given to you by your health care provider. Make sure you discuss any questions you have with your health care provider. °  °Document Released: 10/12/2008 Document Revised: 08/06/2014 Document Reviewed: 09/16/2013 °Elsevier Interactive Patient Education ©2016 Elsevier Inc. ° °

## 2016-02-08 NOTE — ED Provider Notes (Signed)
CSN: [id number]     Arrival date & time 02/08/16  1046 History   First MD Initiated Contact with Patient 02/08/16 1142     Chief Complaint  Patient presents with  . Knee Injury   (Consider location/radiation/quality/duration/timing/severity/associated sxs/prior Treatment) HPI History obtained from patient: Location:  Left knee Context/Duration: An basketball Sunday came down. After rebound with a player on his back and felt his knee pop. He immediately was unable to weight-bear. Is continued to have swelling of the knee.  Severity: 6  Quality: Ache Timing:       Constant     Home Treatment: Cold compresses, ibuprofen Associated symptoms:  Unable to weight-bear Family History: Hypertension    History reviewed. No pertinent past medical history. History reviewed. No pertinent past surgical history. History reviewed. No pertinent family history. Social History  Substance Use Topics  . Smoking status: Current Every Day Smoker    Types: Cigars  . Smokeless tobacco: None  . Alcohol Use: Yes     Comment: socially    Review of Systems  Denies: HEADACHE, NAUSEA, ABDOMINAL PAIN, CHEST PAIN, CONGESTION, DYSURIA, SHORTNESS OF BREATH  Allergies  Review of patient's allergies indicates no known allergies.  Home Medications   Prior to Admission medications   Medication Sig Start Date End Date Taking? Authorizing Provider  acetaminophen (TYLENOL) 325 MG tablet Take 650 mg by mouth every 6 (six) hours as needed. For tooth pain    Historical Provider, MD  cephALEXin (KEFLEX) 500 MG capsule Take 2 capsules (1,000 mg total) by mouth 3 (three) times daily. 10/05/13   Graylon GoodZachary H Baker, PA-C  clotrimazole (LOTRIMIN) 1 % cream Apply 1 application topically 2 (two) times daily.    Historical Provider, MD  ibuprofen (ADVIL,MOTRIN) 800 MG tablet Take 1 tablet (800 mg total) by mouth 3 (three) times daily. 12/29/14   Elson AreasLeslie K Sofia, PA-C  terbinafine (LAMISIL) 250 MG tablet Take 1 tablet (250 mg total)  by mouth daily. 10/05/13   Graylon GoodZachary H Baker, PA-C  Tolnaftate (TINACTIN EX) Apply topically.    Historical Provider, MD   Meds Ordered and Administered this Visit  Medications - No data to display  BP 139/71 mmHg  Pulse 60  Temp(Src) 98.5 F (36.9 C) (Oral)  Resp 12  SpO2 98% No data found.   Physical Exam NURSES NOTES AND VITAL SIGNS REVIEWED. CONSTITUTIONAL: Well developed, well nourished, no acute distress HEENT: normocephalic, atraumatic EYES: Conjunctiva normal NECK:normal ROM, supple, no adenopathy PULMONARY:No respiratory distress, normal effort ABDOMINAL: Soft, ND, NT BS+, No CVAT MUSCULOSKELETAL: Decreased range of motion of the left knee there is palpable effusion. Patient will not allow valgus and varus stresses to test stability of the knee. Does not tolerate anterior drawer sign. Weightbearing is minimal by patient's report. SKIN: warm and dry without rash PSYCHIATRIC: Mood and affect, behavior are normal  ED Course  Procedures (including critical care time)  Labs Review Labs Reviewed - No data to display  Imaging Review Dg Knee Complete 4 Views Left  02/08/2016  CLINICAL DATA:  Basketball injury.  Knee pain. EXAM: LEFT KNEE - COMPLETE 4+ VIEW COMPARISON:  None. FINDINGS: small to moderate joint effusion. No acute bony abnormality. Specifically, no fracture, subluxation, or dislocation. Soft tissues are intact. IMPRESSION: No bony abnormality.  Small to moderate joint effusion. Electronically Signed   By: Charlett NoseKevin  Dover M.D.   On: 02/08/2016 12:40    Discussed with patient prior to discharge Visual Acuity Review  Right Eye Distance:   Left  Eye Distance:   Bilateral Distance:    Right Eye Near:   Left Eye Near:    Bilateral Near:         MDM   1. Knee injury, left, initial encounter     Patient is reassured that there are no issues that require transfer to higher level of care at this time or additional tests. Patient is advised to continue home  symptomatic treatment. Patient is advised that if there are new or worsening symptoms to attend the emergency department, contact primary care provider, or return to UC. Instructions of care provided discharged home in stable condition.    THIS NOTE WAS GENERATED USING A VOICE RECOGNITION SOFTWARE PROGRAM. ALL REASONABLE EFFORTS  WERE MADE TO PROOFREAD THIS DOCUMENT FOR ACCURACY.  I have verbally reviewed the discharge instructions with the patient. A printed AVS was given to the patient.  All questions were answered prior to discharge.      Tharon Aquas, PA 02/08/16 1353

## 2016-02-08 NOTE — ED Notes (Signed)
Pt reports  l  Knee    Pain /  Injury     He    reports  He  Was playing  Basketball      When   aplayer landed  On the  Affected knee         he  Has  Pain on movement  And  On  Weight  Bearing

## 2016-09-05 IMAGING — CR DG KNEE COMPLETE 4+V*R*
4 series · 4 of 4 positions shown · non-contrast
Comparison: None.

CLINICAL DATA: 34-year-old male with right knee pain and swelling
localizing to the lateral aspect of the knee. Knee buckled 2 days
previously while playing basketball.

EXAM:
RIGHT KNEE - COMPLETE 4+ VIEW

[knee ap]
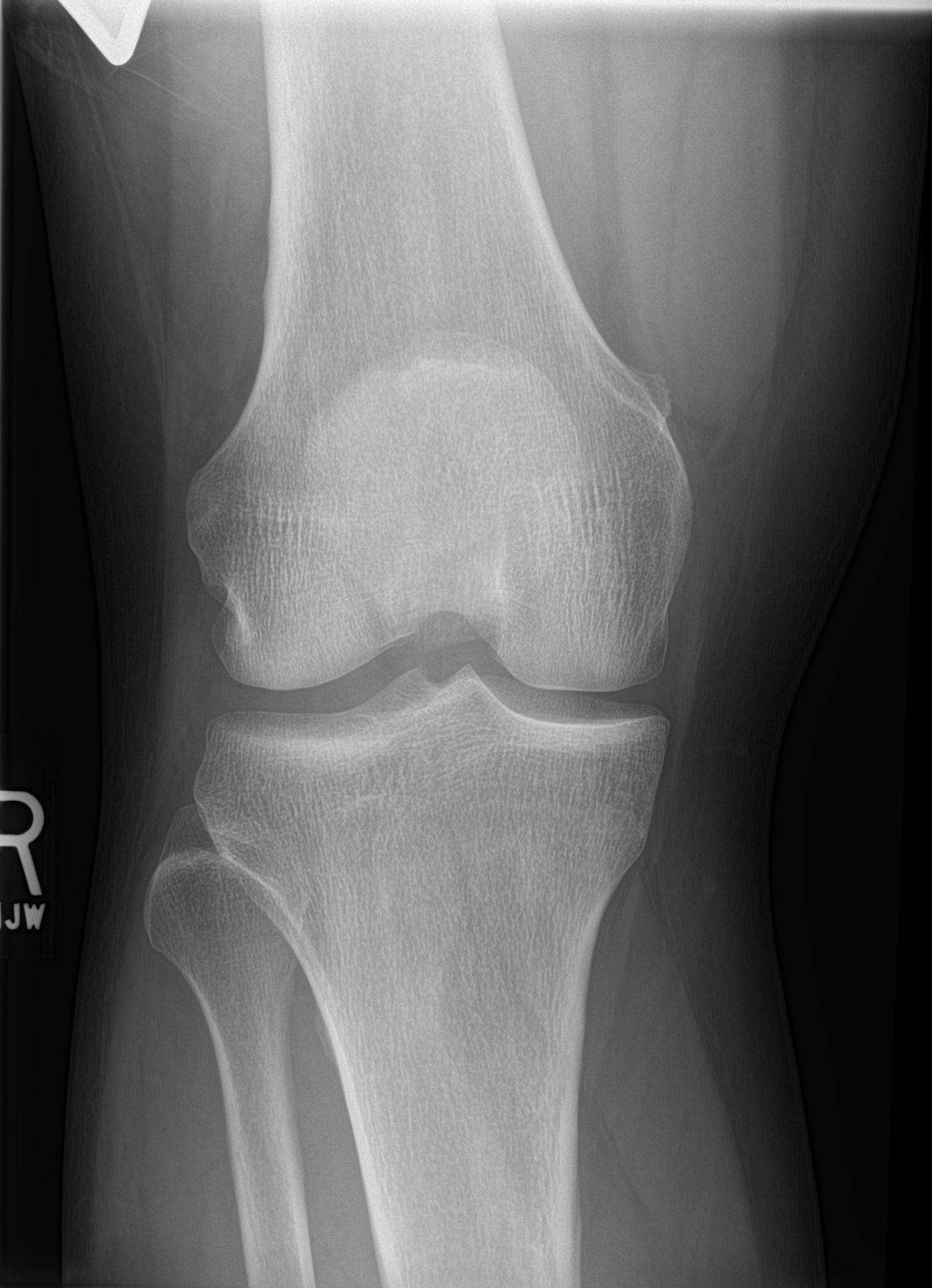

[knee lat]
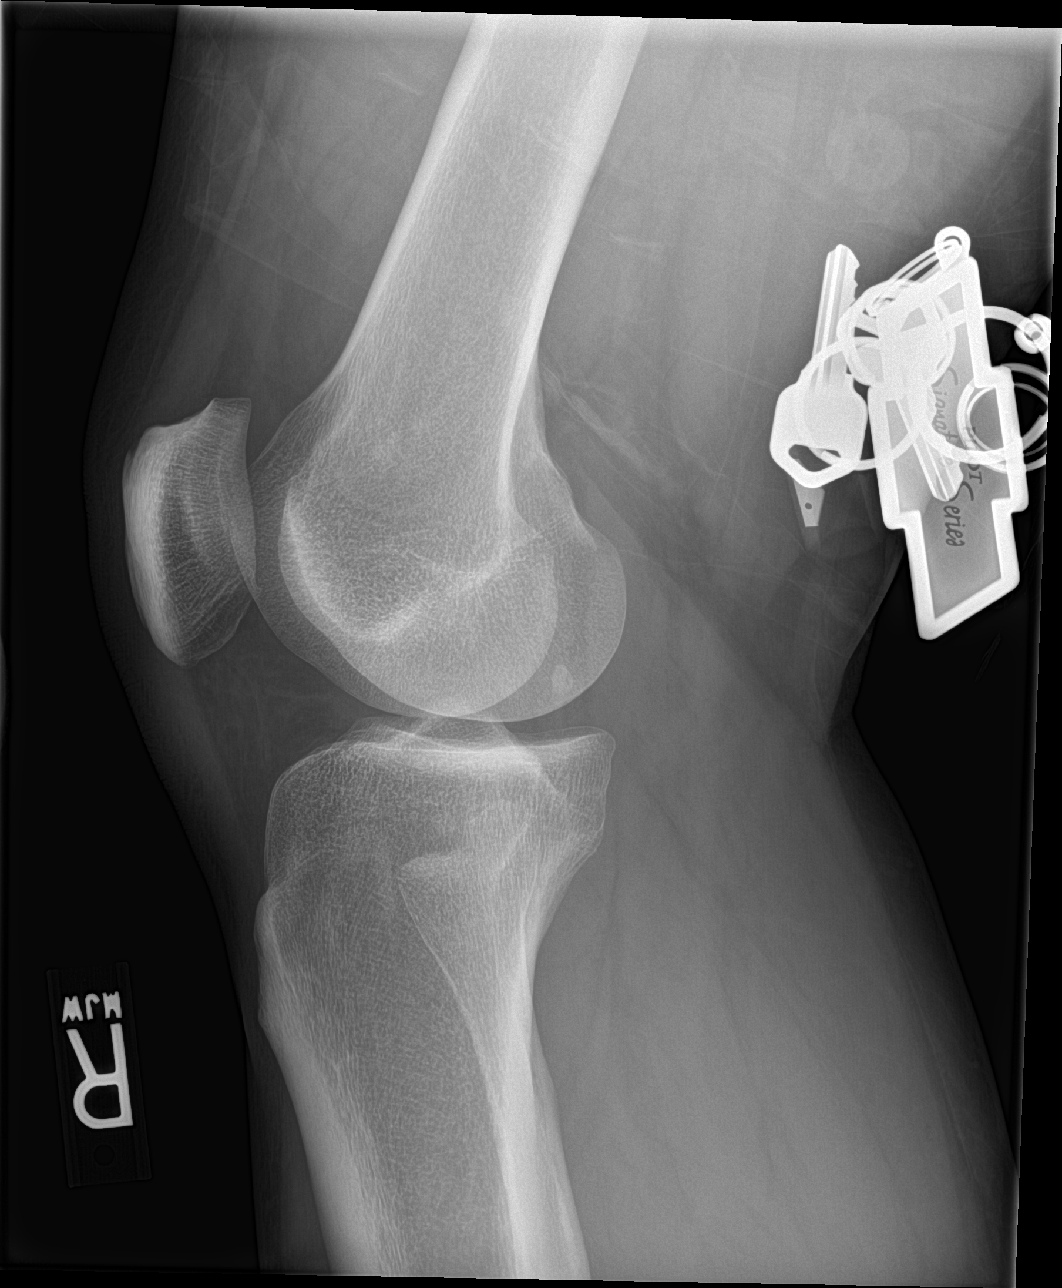

[knee obl (1 of 2)]
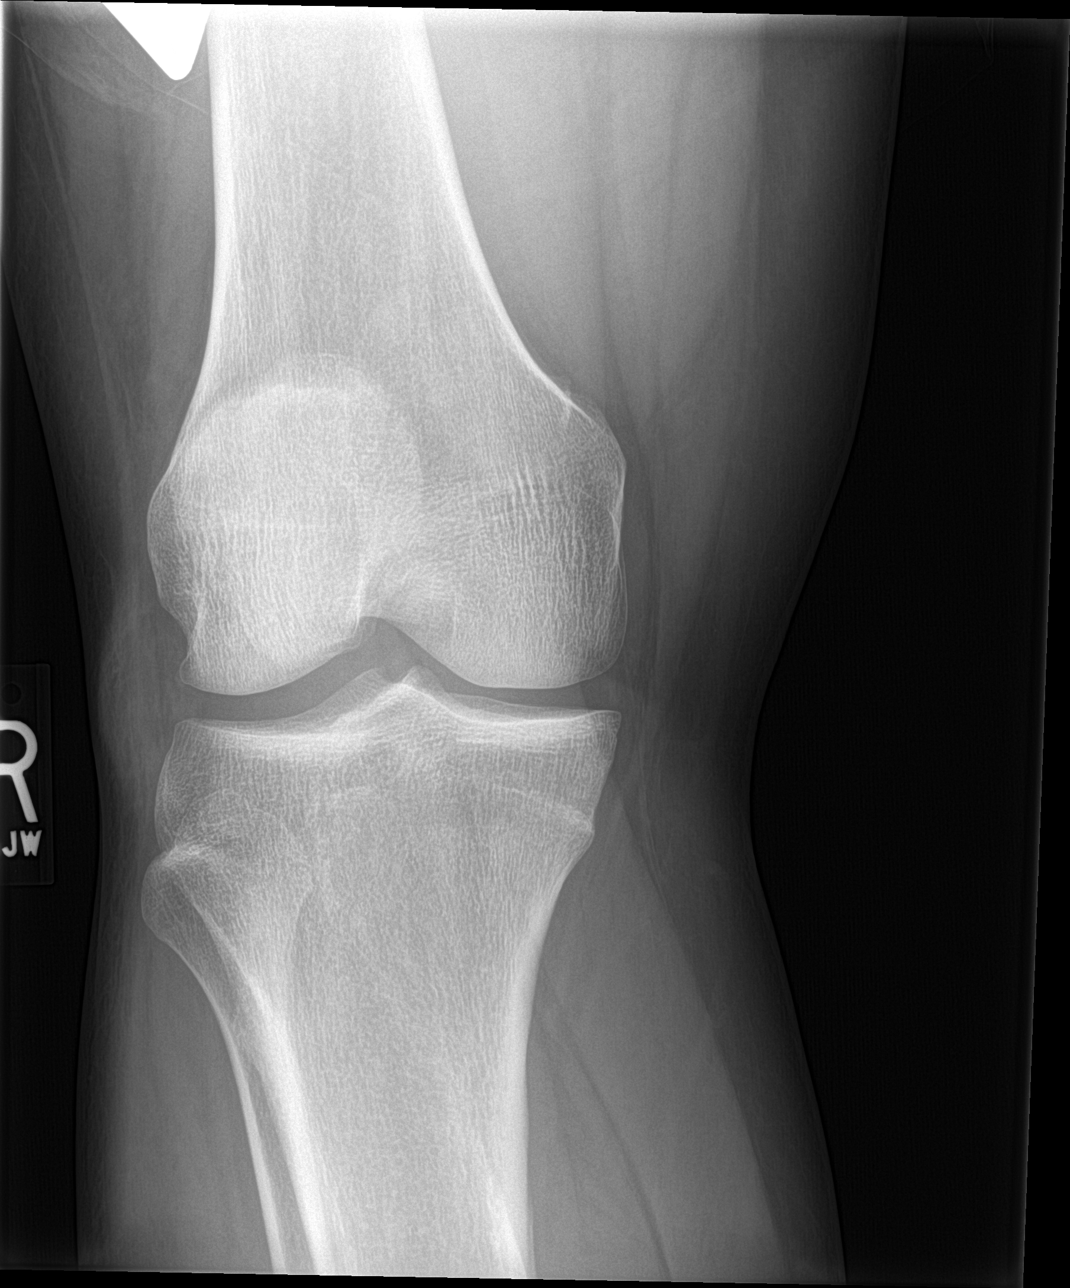

[knee obl (2 of 2)]
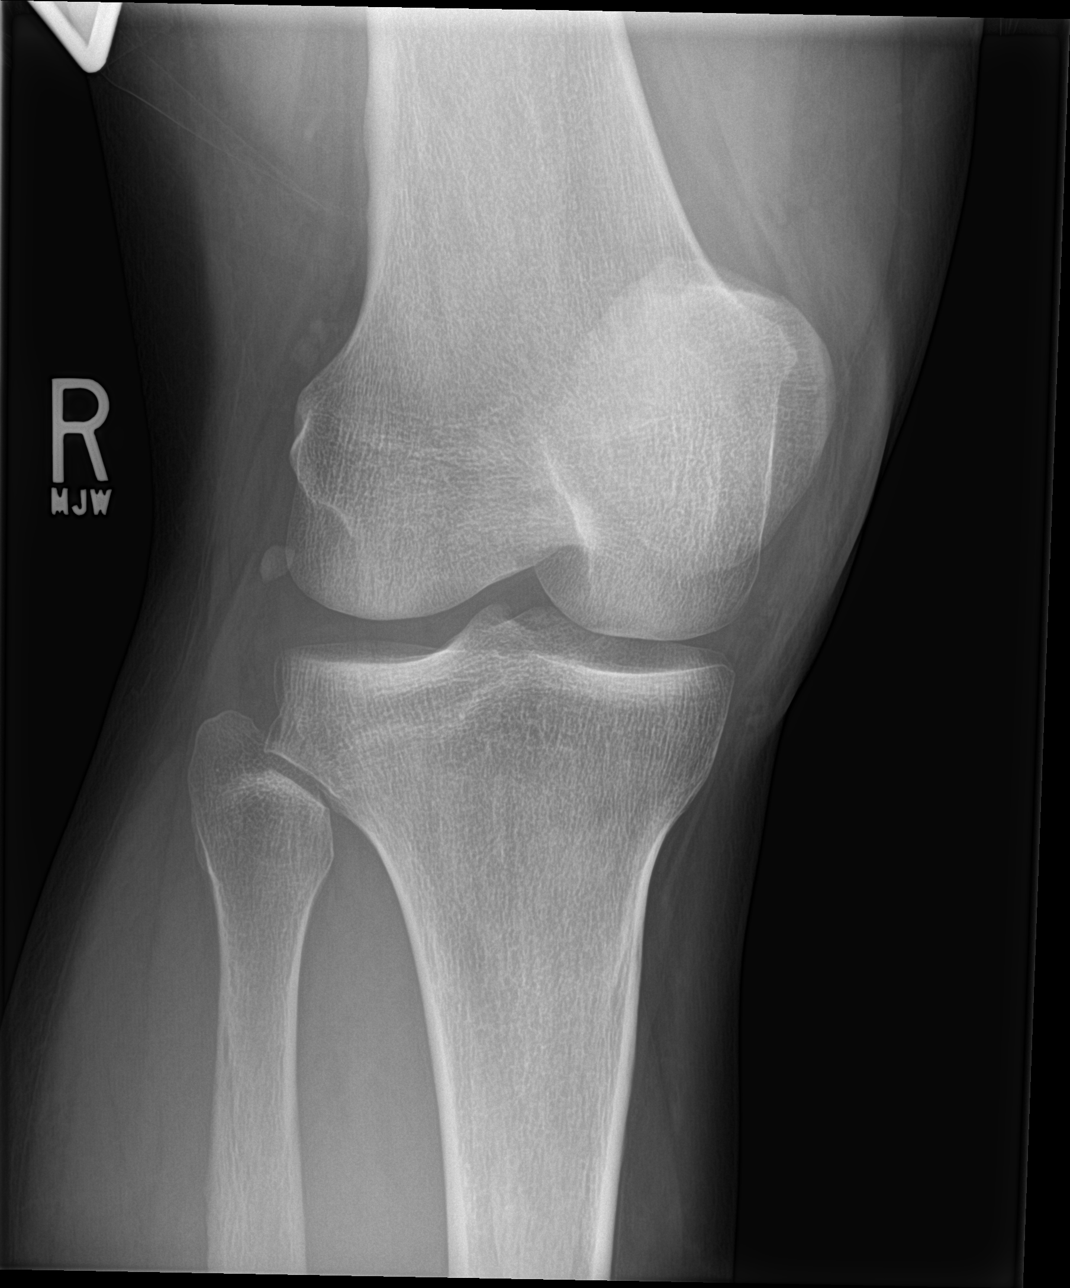

[4 of 4 positions shown; findings below may reference images not displayed]

FINDINGS: There is no evidence of fracture, dislocation, or joint effusion.
There is no evidence of arthropathy or other focal bone abnormality.
Soft tissues are unremarkable.
IMPRESSION: Negative.

## 2018-02-07 ENCOUNTER — Ambulatory Visit (HOSPITAL_COMMUNITY)
Admission: EM | Admit: 2018-02-07 | Discharge: 2018-02-07 | Disposition: A | Discharge status: Home / Self Care | Payer: Self-pay | Attending: Family Medicine | Admitting: Family Medicine

## 2018-02-07 DIAGNOSIS — Z79899 Other long term (current) drug therapy: Secondary | ICD-10-CM | POA: Insufficient documentation

## 2018-02-07 DIAGNOSIS — R1013 Epigastric pain: Secondary | ICD-10-CM

## 2018-02-07 DIAGNOSIS — Z8249 Family history of ischemic heart disease and other diseases of the circulatory system: Secondary | ICD-10-CM | POA: Insufficient documentation

## 2018-02-07 DIAGNOSIS — E860 Dehydration: Secondary | ICD-10-CM

## 2018-02-07 DIAGNOSIS — R030 Elevated blood-pressure reading, without diagnosis of hypertension: Secondary | ICD-10-CM

## 2018-02-07 DIAGNOSIS — Z720 Tobacco use: Secondary | ICD-10-CM | POA: Insufficient documentation

## 2018-02-07 LAB — CBC WITH DIFFERENTIAL/PLATELET
Abs Immature Granulocytes: 0 10*3/uL (ref 0.0–0.1)
BASOS ABS: 0 10*3/uL (ref 0.0–0.1)
BASOS PCT: 1 %
EOS ABS: 0.1 10*3/uL (ref 0.0–0.7)
Eosinophils Relative: 2 %
HCT: 47.1 % (ref 39.0–52.0)
Hemoglobin: 15.2 g/dL (ref 13.0–17.0)
Immature Granulocytes: 0 %
LYMPHS PCT: 39 %
Lymphs Abs: 1.6 10*3/uL (ref 0.7–4.0)
MCH: 28.1 pg (ref 26.0–34.0)
MCHC: 32.3 g/dL (ref 30.0–36.0)
MCV: 87.2 fL (ref 78.0–100.0)
Monocytes Absolute: 0.3 10*3/uL (ref 0.1–1.0)
Monocytes Relative: 8 %
Neutro Abs: 2.2 10*3/uL (ref 1.7–7.7)
Neutrophils Relative %: 50 %
PLATELETS: 236 10*3/uL (ref 150–400)
RBC: 5.4 MIL/uL (ref 4.22–5.81)
RDW: 12.4 % (ref 11.5–15.5)
WBC: 4.2 10*3/uL (ref 4.0–10.5)

## 2018-02-07 LAB — POCT I-STAT, CHEM 8
BUN: 14 mg/dL (ref 6–20)
CALCIUM ION: 1.18 mmol/L (ref 1.15–1.40)
CHLORIDE: 102 mmol/L (ref 98–111)
Creatinine, Ser: 1.3 mg/dL — ABNORMAL HIGH (ref 0.61–1.24)
Glucose, Bld: 99 mg/dL (ref 70–99)
HCT: 48 % (ref 39.0–52.0)
Hemoglobin: 16.3 g/dL (ref 13.0–17.0)
Potassium: 4.1 mmol/L (ref 3.5–5.1)
SODIUM: 140 mmol/L (ref 135–145)
TCO2: 26 mmol/L (ref 22–32)

## 2018-02-07 MED ORDER — GI COCKTAIL ~~LOC~~
ORAL | Status: AC
  Filled 2018-02-07: qty 30

## 2018-02-07 MED ORDER — ONDANSETRON 4 MG PO TBDP
4.0000 mg | ORAL_TABLET | Freq: Once | ORAL | Status: AC
  Administered 2018-02-07: 4 mg via ORAL

## 2018-02-07 MED ORDER — ONDANSETRON HCL 4 MG PO TABS: 4.0000 mg | ORAL_TABLET | Freq: Four times a day (QID) | ORAL | 0 refills | Status: DC

## 2018-02-07 MED ORDER — OMEPRAZOLE 40 MG PO CPDR: 40.0000 mg | DELAYED_RELEASE_CAPSULE | Freq: Every day | ORAL | 0 refills | Status: DC

## 2018-02-07 MED ORDER — GI COCKTAIL ~~LOC~~
30.0000 mL | Freq: Once | ORAL | Status: AC
  Administered 2018-02-07: 30 mL via ORAL

## 2018-02-07 MED ORDER — ONDANSETRON 4 MG PO TBDP
ORAL_TABLET | ORAL | Status: AC
  Filled 2018-02-07: qty 1

## 2018-02-07 NOTE — ED Triage Notes (Signed)
Pt presents with severe abdominal pain and dizziness

## 2018-02-07 NOTE — Discharge Instructions (Addendum)
Take Zofran as needed for the nausea and vomiting Take omeprazole once a day for the stomach pain  call [facility] to be seen for your blood pressure Return promptly if you have any worsening pain, fever or chills, or continue to have vomiting

## 2018-02-07 NOTE — ED Provider Notes (Signed)
[facility]    CSN: [id number] Arrival date & time: 02/07/18  0901     History   Chief Complaint Chief Complaint  Patient presents with  . Abdominal Pain  . Dizziness    HPI Chris Greene is a 37 y.o. male.   HPI  Patient is here for abdominal pain.  He states he had nausea vomiting abdominal pain since last night.  He states that his brother brought him [employer], and he did not eat them for a couple of hours.  1 hour after eating these he started having abdominal crampy pain and vomiting.  Pain is in his upper abdomen.  No fever chills.  No diarrhea.  He states he has not tried to eat or drink anything since last night.  He had a normal bowel movement at 530 this morning.  No past history of ulcers or stomach disease.  No known food intolerance. Patient states that he has had headache for about a month.  His mother took his blood pressure at home and told him it was elevated.  He is never been evaluated for hypertension.  He does not have a primary care doctor.  He states hypertension does run in his family.  No dizziness or visual change, no nausea.  No history of migraine or headache condition.  No past medical history on file.  There are no active problems to display for this patient.   No past surgical history on file.     Home Medications    Prior to Admission medications   Medication Sig Start Date End Date Taking? Authorizing Provider  acetaminophen (TYLENOL) 325 MG tablet Take 650 mg by mouth every 6 (six) hours as needed. For tooth pain    [provider]  omeprazole (PRILOSEC) 40 MG capsule Take 1 capsule (40 mg total) by mouth daily. 02/07/18   Eustace Moore, MD  ondansetron (ZOFRAN) 4 MG tablet Take 1 tablet (4 mg total) by mouth every 6 (six) hours. 02/07/18   Eustace Moore, MD    Family History No family history on file. She states family history of hypertension Social History Social History   Tobacco Use    . Smoking status: Current Every Day Smoker    Types: Cigars  Substance Use Topics  . Alcohol use: Yes    Comment: socially  . Drug use: Yes    Types: Marijuana     Allergies   Patient has no known allergies.   Review of Systems Review of Systems  Constitutional: Positive for appetite change and fatigue. Negative for chills and fever.  HENT: Negative for dental problem, ear pain and sore throat.   Eyes: Negative for photophobia, pain and visual disturbance.  Respiratory: Negative for cough and shortness of breath.   Cardiovascular: Negative for chest pain and palpitations.  Gastrointestinal: Positive for abdominal pain, nausea and vomiting. Negative for diarrhea.  Genitourinary: Negative for dysuria and hematuria.  Musculoskeletal: Negative for arthralgias and back pain.  Skin: Negative for color change and rash.  Neurological: Positive for headaches. Negative for seizures and syncope.  Psychiatric/Behavioral: Negative for dysphoric mood. The patient is not nervous/anxious.   All other systems reviewed and are negative.    Physical Exam Triage Vital Signs ED Triage Vitals  Enc Vitals Group     BP 02/07/18 0911 (!) 149/108     Pulse Rate 02/07/18 0911 89     Resp 02/07/18 0911 18     Temp 02/07/18 0911  97.9 F (36.6 C)     Temp Source 02/07/18 0911 Oral     SpO2 02/07/18 0911 99 %     Weight --      Height --      Head Circumference --      Peak Flow --      Pain Score 02/07/18 0914 10     Pain Loc --      Pain Edu? --      Excl. in GC? --    No data found.  Updated Vital Signs BP (!) 149/108 (BP Location: Left Arm)   Pulse 89   Temp 97.9 F (36.6 C) (Oral)   Resp 18   SpO2 99%      Physical Exam  Constitutional: He is oriented to person, place, and time. He appears well-developed and well-nourished. He appears ill. He appears distressed.  Leans over and holds abdomen.  Appears acutely uncomfortable.  HENT:  Head: Normocephalic and atraumatic.   Mouth/Throat: Oropharynx is clear and moist. No oropharyngeal exudate.  Eyes: Pupils are equal, round, and reactive to light. Conjunctivae and EOM are normal.  Neck: Normal range of motion.  Cardiovascular: Normal rate, regular rhythm and normal heart sounds.  Pulmonary/Chest: Effort normal and breath sounds normal. No respiratory distress. He has no rhonchi. He has no rales.  Abdominal: Soft. Normal appearance and bowel sounds are normal. He exhibits no distension. There is no hepatosplenomegaly. There is tenderness in the epigastric area. There is no rebound and no guarding.  Musculoskeletal: Normal range of motion. He exhibits no edema.  Neurological: He is alert and oriented to person, place, and time.  Skin: Skin is warm and dry.  Psychiatric: He has a normal mood and affect. His behavior is normal.     UC Treatments / Results  Labs (all labs ordered are listed, but only abnormal results are displayed) Labs Reviewed  POCT I-STAT, CHEM 8 - Abnormal; Notable for the following components:      Result Value   Creatinine, Ser 1.30 (*)    All other components within normal limits  CBC WITH DIFFERENTIAL/PLATELET    EKG None  Radiology No results found.  Procedures Procedures   Medications Ordered in UC Medications  gi cocktail (Maalox,Lidocaine,Donnatal) (30 mLs Oral Given 02/07/18 1003)  ondansetron (ZOFRAN-ODT) disintegrating tablet 4 mg (4 mg Oral Given 02/07/18 1003)    Initial Impression / Assessment and Plan / UC Course  I have reviewed the triage vital signs and the nursing notes.  Pertinent labs & imaging results that were available during my care of the patient were reviewed by me and considered in my medical decision making (see chart for details).     Discussed with palliation I think this in epigastric distress from the food.  It does not seem to be a gastroenteritis.  He feels dramatically better after a GI cocktail and Zofran. Final Clinical Impressions(s) /  UC Diagnoses   Final diagnoses:  Epigastric pain  Dehydration  Elevated blood pressure reading     Discharge Instructions     Take Zofran as needed for the nausea and vomiting Take omeprazole once a day for the stomach pain  call [facility] to be seen for your blood pressure Return promptly if you have any worsening pain, fever or chills, or continue to have vomiting    ED Prescriptions    Medication Sig Dispense Auth. Provider   ondansetron (ZOFRAN) 4 MG tablet Take 1 tablet (4 mg total)  by mouth every 6 (six) hours. 12 tablet Eustace Moore, MD   omeprazole (PRILOSEC) 40 MG capsule Take 1 capsule (40 mg total) by mouth daily. 15 capsule Eustace Moore, MD     Controlled Substance Prescriptions [place] Controlled Substance Registry consulted? Not Applicable   Eustace Moore, MD 02/07/18 1056

## 2018-03-02 ENCOUNTER — Other Ambulatory Visit: Payer: Self-pay

## 2018-03-02 ENCOUNTER — Emergency Department (HOSPITAL_COMMUNITY)
Admission: EM | Admit: 2018-03-02 | Discharge: 2018-03-02 | Disposition: A | Discharge status: Home / Self Care | Payer: Self-pay | Attending: Emergency Medicine | Admitting: Emergency Medicine

## 2018-03-02 ENCOUNTER — Encounter (HOSPITAL_COMMUNITY): Payer: Self-pay | Admitting: Emergency Medicine

## 2018-03-02 DIAGNOSIS — Z79899 Other long term (current) drug therapy: Secondary | ICD-10-CM | POA: Insufficient documentation

## 2018-03-02 DIAGNOSIS — F17210 Nicotine dependence, cigarettes, uncomplicated: Secondary | ICD-10-CM | POA: Insufficient documentation

## 2018-03-02 DIAGNOSIS — R51 Headache: Secondary | ICD-10-CM | POA: Insufficient documentation

## 2018-03-02 DIAGNOSIS — R519 Headache, unspecified: Secondary | ICD-10-CM

## 2018-03-02 LAB — I-STAT CHEM 8, ED
BUN: 9 mg/dL (ref 6–20)
CALCIUM ION: 1.16 mmol/L (ref 1.15–1.40)
CREATININE: 1.2 mg/dL (ref 0.61–1.24)
Chloride: 103 mmol/L (ref 98–111)
GLUCOSE: 96 mg/dL (ref 70–99)
HCT: 43 % (ref 39.0–52.0)
Hemoglobin: 14.6 g/dL (ref 13.0–17.0)
Potassium: 3.9 mmol/L (ref 3.5–5.1)
Sodium: 141 mmol/L (ref 135–145)
TCO2: 26 mmol/L (ref 22–32)

## 2018-03-02 MED ORDER — SODIUM CHLORIDE 0.9 % IV BOLUS
1000.0000 mL | Freq: Once | INTRAVENOUS | Status: AC
  Administered 2018-03-02: 1000 mL via INTRAVENOUS

## 2018-03-02 MED ORDER — METOCLOPRAMIDE HCL 5 MG/ML IJ SOLN
10.0000 mg | Freq: Once | INTRAMUSCULAR | Status: AC
  Administered 2018-03-02: 10 mg via INTRAVENOUS
  Filled 2018-03-02: qty 2

## 2018-03-02 MED ORDER — DIPHENHYDRAMINE HCL 50 MG/ML IJ SOLN
12.5000 mg | Freq: Once | INTRAMUSCULAR | Status: AC
  Administered 2018-03-02: 12.5 mg via INTRAVENOUS
  Filled 2018-03-02: qty 1

## 2018-03-02 MED ORDER — KETOROLAC TROMETHAMINE 15 MG/ML IJ SOLN
15.0000 mg | Freq: Once | INTRAMUSCULAR | Status: AC
  Administered 2018-03-02: 15 mg via INTRAVENOUS
  Filled 2018-03-02: qty 1

## 2018-03-02 MED ORDER — DEXAMETHASONE SODIUM PHOSPHATE 10 MG/ML IJ SOLN
10.0000 mg | Freq: Once | INTRAMUSCULAR | Status: AC
  Administered 2018-03-02: 10 mg via INTRAVENOUS
  Filled 2018-03-02: qty 1

## 2018-03-02 NOTE — Discharge Instructions (Addendum)
[facility]

## 2018-03-02 NOTE — ED Provider Notes (Signed)
[facility] Provider Note   CSN: [id number] Arrival date & time: 03/02/18  [id number]     History   Chief Complaint Chief Complaint  Patient presents with  . Headache    HPI Chris Greene is a 37 y.o. male who presents with a headache.  No significant past medical history.  He states that the headache is over the left temple and is constant and throbbing.  Is worsened over the past 2 days but he has had this headache on and off for the past several weeks.  Is been going on since he went to urgent care several weeks ago for a GI issue.  He has been taking 600 mg of ibuprofen without significant relief.  He reports associated photophobia. He denies head trauma, acute onset, LOC, vomiting, seizures, syncope, numbness/tingling, weakness.    HPI  History reviewed. No pertinent past medical history.  There are no active problems to display for this patient.   History reviewed. No pertinent surgical history.      Home Medications    Prior to Admission medications   Medication Sig Start Date End Date Taking? Authorizing Provider  acetaminophen (TYLENOL) 325 MG tablet Take 650 mg by mouth every 6 (six) hours as needed. For tooth pain    [provider]  omeprazole (PRILOSEC) 40 MG capsule Take 1 capsule (40 mg total) by mouth daily. 02/07/18   Eustace MooreNelson, Yvonne Sue, MD  ondansetron (ZOFRAN) 4 MG tablet Take 1 tablet (4 mg total) by mouth every 6 (six) hours. 02/07/18   Eustace MooreNelson, Yvonne Sue, MD    Family History No family history on file.  Social History Social History   Tobacco Use  . Smoking status: Current Every Day Smoker    Types: Cigars  . Smokeless tobacco: Current User  Substance Use Topics  . Alcohol use: Yes    Comment: socially  . Drug use: Yes    Types: Marijuana     Allergies   Patient has no known allergies.   Review of Systems Review of Systems  Constitutional: Negative for fever.  Eyes: Positive for  photophobia. Negative for visual disturbance.  Musculoskeletal: Negative for neck stiffness.  Neurological: Positive for headaches. Negative for dizziness, weakness and numbness.  All other systems reviewed and are negative.    Physical Exam Updated Vital Signs BP (!) 161/119 (BP Location: Right Arm)   Pulse 70   Temp 98.7 F (37.1 C) (Oral)   Resp 17   Ht 6\' 2"  (1.88 m)   Wt 133.8 kg (295 lb)   SpO2 98%   BMI 37.88 kg/m   Physical Exam  Constitutional: He is oriented to person, place, and time. He appears well-developed and well-nourished. No distress.  Calm, cooperative, overweight  HENT:  Head: Normocephalic and atraumatic.  Eyes: Pupils are equal, round, and reactive to light. Conjunctivae are normal. Right eye exhibits no discharge. Left eye exhibits no discharge. No scleral icterus.  Neck: Normal range of motion.  Cardiovascular: Normal rate and regular rhythm.  Pulmonary/Chest: Effort normal and breath sounds normal. No respiratory distress.  Abdominal: He exhibits no distension.  Neurological: He is alert and oriented to person, place, and time.  Lying on stretcher in NAD. GCS 15. Speaks in a clear voice. Cranial nerves II through XII grossly intact. Pain with EOM. 5/5 strength in all extremities. Sensation fully intact.  Bilateral finger-to-nose intact. Ambulatory    Skin: Skin is warm and dry.  Psychiatric: He has a  normal mood and affect. His behavior is normal.  Nursing note and vitals reviewed.    ED Treatments / Results  Labs (all labs ordered are listed, but only abnormal results are displayed) Labs Reviewed  I-STAT CHEM 8, ED    EKG None  Radiology No results found.  Procedures Procedures (including critical care time)  Medications Ordered in ED Medications  ketorolac (TORADOL) 15 MG/ML injection 15 mg (has no administration in time range)  metoCLOPramide (REGLAN) injection 10 mg (has no administration in time range)  dexamethasone (DECADRON)  injection 10 mg (has no administration in time range)  diphenhydrAMINE (BENADRYL) injection 12.5 mg (has no administration in time range)  sodium chloride 0.9 % bolus 1,000 mL (has no administration in time range)     Initial Impression / Assessment and Plan / ED Course  I have reviewed the triage vital signs and the nursing notes.  Pertinent labs & imaging results that were available during my care of the patient were reviewed by me and considered in my medical decision making (see chart for details).  37 year old male presents with intermittent headache for several weeks which had gradually worsened over the last several days. He is hypertensive on arrival. His neurologic exam is normal and he has no red flags in history. He was given a migraine cocktail and on recheck feels improved. Chem 8 is unremarkable. He was advised to have his BP rechecked at [facility]. He verbalized understanding.  Final Clinical Impressions(s) / ED Diagnoses   Final diagnoses:  Bad headache    ED Discharge Orders    None       Bethel Born, PA-C 03/02/18 1013    Tilden Fossa, MD 03/04/18 1259

## 2018-03-02 NOTE — ED Triage Notes (Signed)
Pt. Stated, Chris Greene had a headache and high pressure , started again 2 nights ago.

## 2018-03-02 NOTE — ED Notes (Signed)
Pt given ice water per Dr. Rees.  

## 2018-04-23 ENCOUNTER — Other Ambulatory Visit: Payer: Self-pay

## 2018-04-23 ENCOUNTER — Encounter (HOSPITAL_COMMUNITY): Payer: Self-pay | Admitting: Emergency Medicine

## 2018-04-23 ENCOUNTER — Emergency Department (HOSPITAL_BASED_OUTPATIENT_CLINIC_OR_DEPARTMENT_OTHER): Payer: Self-pay

## 2018-04-23 ENCOUNTER — Emergency Department (HOSPITAL_COMMUNITY): Payer: Self-pay

## 2018-04-23 ENCOUNTER — Emergency Department (HOSPITAL_COMMUNITY)
Admission: EM | Admit: 2018-04-23 | Discharge: 2018-04-23 | Disposition: A | Discharge status: Home / Self Care | Payer: Self-pay | Attending: Emergency Medicine | Admitting: Emergency Medicine

## 2018-04-23 DIAGNOSIS — M79609 Pain in unspecified limb: Secondary | ICD-10-CM

## 2018-04-23 DIAGNOSIS — M79662 Pain in left lower leg: Secondary | ICD-10-CM | POA: Insufficient documentation

## 2018-04-23 DIAGNOSIS — Z79899 Other long term (current) drug therapy: Secondary | ICD-10-CM | POA: Insufficient documentation

## 2018-04-23 DIAGNOSIS — F17290 Nicotine dependence, other tobacco product, uncomplicated: Secondary | ICD-10-CM | POA: Insufficient documentation

## 2018-04-23 MED ORDER — ACETAMINOPHEN 500 MG PO TABS
1000.0000 mg | ORAL_TABLET | Freq: Once | ORAL | Status: AC
  Administered 2018-04-23: 1000 mg via ORAL
  Filled 2018-04-23: qty 2

## 2018-04-23 NOTE — Progress Notes (Signed)
*  Preliminary Results* Left lower extremity venous duplex completed. Left lower extremity is negative for deep vein thrombosis. There is no evidence of left Baker's cyst.  04/23/2018 9:40 AM  Gertie Fey, MHA, RVT, RDCS, RDMS

## 2018-04-23 NOTE — ED Provider Notes (Signed)
[facility] EMERGENCY DEPARTMENT Provider Note   CSN: [id number] Arrival date & time: 04/23/18  4098     History   Chief Complaint Chief Complaint  Patient presents with  . Leg Pain    HPI DERVIN Chris Greene is a 37 y.o. male who presents for evaluation of persistent left calf pain that has been ongoing for the last 5-6 months. He states that he notices the pain most when he is getting out of bed in the morning and steps to stand up. The pain happens every morning. He has not taken any medication for the pain. He has not had any new injury, trauma. He reports that the pain sometimes gets worse throughout the day. He denies any new or changes in symptoms but states he came to the ED because he was worried about a blood clot in his leg. He has still been able to ambulate without any difficulty. He denies recent immobilization, prior history of DVT/PE, recent surgery, leg swelling, or long travel. Patient denies any fevers, numbness/weakness.     The history is provided by the patient.    History reviewed. No pertinent past medical history.  There are no active problems to display for this patient.   History reviewed. No pertinent surgical history.      Home Medications    Prior to Admission medications   Medication Sig Start Date End Date Taking? Authorizing Provider  acetaminophen (TYLENOL) 325 MG tablet Take 650 mg by mouth every 6 (six) hours as needed for headache.    Yes [provider]  Carboxymethylcellulose Sodium (EYE DROPS OP) Place 1 drop into both eyes daily as needed (redness).    Yes [provider]    Family History No family history on file.  Social History Social History   Tobacco Use  . Smoking status: Current Every Day Smoker    Types: Cigars  . Smokeless tobacco: Never Used  . Tobacco comment: black and milds  Substance Use Topics  . Alcohol use: Yes    Comment: socially  . Drug use: Yes    Types: Marijuana      Allergies   Shellfish allergy   Review of Systems Review of Systems  Constitutional: Negative for fever.  Cardiovascular: Negative for leg swelling.  Musculoskeletal: Negative for gait problem.       LLE pain  Neurological: Negative for weakness and numbness.  All other systems reviewed and are negative.    Physical Exam Updated Vital Signs BP (!) 140/91   Pulse 70   Temp 97.6 F (36.4 C) (Oral)   Resp 16   Ht 6\' 2"  (1.88 m)   Wt 136.1 kg   SpO2 98%   BMI 38.52 kg/m   Physical Exam  Constitutional: He appears well-developed and well-nourished.  HENT:  Head: Normocephalic and atraumatic.  Eyes: Conjunctivae and EOM are normal. Right eye exhibits no discharge. Left eye exhibits no discharge. No scleral icterus.  Cardiovascular:  Pulses:      Dorsalis pedis pulses are 2+ on the right side, and 2+ on the left side.  Pulmonary/Chest: Effort normal.  Musculoskeletal:  Diffuse tenderness to palpation along the lateral aspect of the left calf. No deformity or crepitus noted. No overlying edema, erythema, or warmth. BLE are symmetric in appearance. No deformity noted along the achilles tendon.   Neurological: He is alert.  5/5 strength of BLE Sensation intact along major nerve distributions of BLE Dorsiflexion and plantarflexion intact without any difficulty.  Skin: Skin is warm and dry. Capillary refill takes less than 2 seconds.  Good distal cap refill.  LLE is not dusky in appearance or cool to touch.  Psychiatric: He has a normal mood and affect. His speech is normal and behavior is normal.  Nursing note and vitals reviewed.    ED Treatments / Results  Labs (all labs ordered are listed, but only abnormal results are displayed) Labs Reviewed - No data to display  EKG None  Radiology Dg Tibia/fibula Left  Result Date: 04/23/2018 CLINICAL DATA:  Initial evaluation for acute pain at proximal tib fib, no injury. EXAM: LEFT TIBIA AND FIBULA - 2 VIEW  COMPARISON:  Prior knee radiograph from 02/08/2016. FINDINGS: No acute fracture or dislocation. Osseous mineralization normal. No soft tissue abnormality. Mild degenerative changes noted about the knee. IMPRESSION: No acute osseous abnormality about the left tibia/fibula. Electronically Signed   By: Rise Mu M.D.   On: 04/23/2018 06:49    Procedures Procedures (including critical care time)  Medications Ordered in ED Medications  acetaminophen (TYLENOL) tablet 1,000 mg (1,000 mg Oral Given 04/23/18 0654)     Initial Impression / Assessment and Plan / ED Course  I have reviewed the triage vital signs and the nursing notes.  Pertinent labs & imaging results that were available during my care of the patient were reviewed by me and considered in my medical decision making (see chart for details).     37 y.o. M who presents for evaluation of left calf pain x 5 months. Worse when getting up in the morning and stepping down on leg. No recent trauma, injury, fall. Patient is afebrile, non-toxic appearing, sitting comfortably on examination table. Vital signs reviewed and stable. Patient is neurovascularly intact. On exam, patient with tenderness noted to the lateral aspect of the LLE. No edema, warmth, erythema. Low suspicion for DVT at this time. Consider MSK injury vs tendonitis. I discussed with patient that this was less likely to be a blood clot given history/physical exam but he is very concerned and wants it evaluated in the ED. I discussed with him that my suspicion for blood clot is low but patient was not reassured. Will plan for U/S evaluation of the LLE.  History/physical exam is not concerning for septic arthritis, acute arterial embolism.  X-ray reviewed.  Negative for any acute bony abnormality.  LLE U/S reviewed. No evidence of DVT or Baker's cyst.  Discussed results with patient.  Explained that this still could be a musculoskeletal injury, tendinitis.  Encourage at home  supportive care measures.  He does not currently have a PCP.  Will give him [facility] to follow-up with for further evaluation of his symptoms. Patient had ample opportunity for questions and discussion. All patient's questions were answered with full understanding. Strict return precautions discussed. Patient expresses understanding and agreement to plan.   Final Clinical Impressions(s) / ED Diagnoses   Final diagnoses:  Pain of left calf    ED Discharge Orders    None       Rosana Hoes 04/23/18 1001    Cardama, Amadeo Garnet, MD 04/29/18 1652

## 2018-04-23 NOTE — ED Triage Notes (Signed)
Pt reports every time he gets out of bed he has left calf pain that has been going on for a couple of months that gets better throughout the day. Pt reports the pain is getting worse. Pt ambulatory in triage. Pt reports hx of straining his knee a few years back and thinks this pain is related. Denies any new injuries.

## 2018-04-23 NOTE — ED Notes (Signed)
Patient transported to Vascular 

## 2018-04-23 NOTE — Discharge Instructions (Addendum)
You can take Tylenol or Ibuprofen as directed for pain. You can alternate Tylenol and Ibuprofen every 4 hours. If you take Tylenol at 1pm, then you can take Ibuprofen at 5pm. Then you can take Tylenol again at 9pm.   Follow-up with referred [facility] to establish primary care doctor.  Return emergency department for any worsening pain, redness or swelling in the leg, numbness/weakness of the leg or any other worsening or concerning symptoms.

## 2021-12-26 ENCOUNTER — Encounter (HOSPITAL_COMMUNITY): Payer: Self-pay | Admitting: Emergency Medicine

## 2021-12-26 ENCOUNTER — Ambulatory Visit (HOSPITAL_COMMUNITY)
Admission: EM | Admit: 2021-12-26 | Discharge: 2021-12-26 | Disposition: A | Discharge status: Home / Self Care | Payer: Self-pay | Attending: Family Medicine | Admitting: Family Medicine

## 2021-12-26 DIAGNOSIS — K0889 Other specified disorders of teeth and supporting structures: Secondary | ICD-10-CM

## 2021-12-26 MED ORDER — AMOXICILLIN 875 MG PO TABS: 875.0000 mg | ORAL_TABLET | Freq: Two times a day (BID) | ORAL | 0 refills | Status: AC

## 2021-12-26 MED ORDER — HYDROCODONE-ACETAMINOPHEN 5-325 MG PO TABS: 1.0000 | ORAL_TABLET | Freq: Four times a day (QID) | ORAL | 0 refills | Status: DC | PRN

## 2021-12-26 NOTE — ED Provider Notes (Signed)
  [facility]   [id number] 12/26/21 Arrival Time: 1328  ASSESSMENT & PLAN:  1. Pain, dental    No sign of abscess requiring I&D at this time. Discussed.  Meds ordered this encounter  Medications   amoxicillin (AMOXIL) 875 MG tablet    Sig: Take 1 tablet (875 mg total) by mouth 2 (two) times daily for 10 days.    Dispense:  20 tablet    Refill:  0   HYDROcodone-acetaminophen (NORCO/VICODIN) 5-325 MG tablet    Sig: Take 1 tablet by mouth every 6 (six) hours as needed for moderate pain or severe pain.    Dispense:  8 tablet    Refill:  0     Controlled Substances Registry consulted for this patient. I feel the risk/benefit ratio today is favorable for proceeding with this prescription for a controlled substance. Medication sedation precautions given.  Dental resource written instructions given. He will schedule dental evaluation as soon as possible if not improving over the next 24-48 hours.  Reviewed expectations re: course of current medical issues. Questions answered. Outlined signs and symptoms indicating need for more acute intervention. Patient verbalized understanding. After Visit Summary given.   SUBJECTIVE:  Chris Greene is a 41 y.o. male who reports gradual onset of left upper dental pain described as aching/throbbing. Present on/off for months; worse past few days. Fever: absent. Tolerating PO intake but reports pain with chewing. Normal swallowing. He does not see a dentist regularly. No neck swelling or pain. OTC analgesics without relief.   OBJECTIVE: Vitals:   12/26/21 1347  BP: (!) 159/103  Pulse: 63  Resp: 18  Temp: 98.3 F (36.8 C)  TempSrc: Oral  SpO2: 98%    General appearance: alert; no distress HENT: normocephalic; atraumatic; dentition: fair; left upper gum without areas of fluctuance, drainage, or bleeding and with tenderness to palpation; normal jaw movement without difficulty Neck: supple without LAD; FROM; trachea  midline Lungs: normal respirations; unlabored; speaks full sentences without difficulty Skin: warm and dry Psychological: alert and cooperative; normal mood and affect  Allergies  Allergen Reactions   Shellfish Allergy Anaphylaxis    History reviewed. No pertinent past medical history. Social History   Socioeconomic History   Marital status: Single    Spouse name: Not on file   Number of children: Not on file   Years of education: Not on file   Highest education level: Not on file  Occupational History   Not on file  Tobacco Use   Smoking status: Every Day    Types: Cigars   Smokeless tobacco: Never   Tobacco comments:    black and milds  Vaping Use   Vaping Use: Never used  Substance and Sexual Activity   Alcohol use: Yes    Comment: socially   Drug use: Yes    Types: Marijuana   Sexual activity: Not on file  Other Topics Concern   Not on file  Social History Narrative   Not on file   Social Determinants of Health   Financial Resource Strain: Not on file  Food Insecurity: Not on file  Transportation Needs: Not on file  Physical Activity: Not on file  Stress: Not on file  Social Connections: Not on file  Intimate Partner Violence: Not on file   No family history on file. History reviewed. No pertinent surgical history.    Mardella Layman, MD 12/26/21 [phone]

## 2021-12-26 NOTE — ED Triage Notes (Signed)
Pt dealing with left upper dental pain since March. Reports has a broken tooth that needs to get removed. Tried taking aleve for pain but not helping. Now having some swelling to left side of face.

## 2021-12-26 NOTE — Discharge Instructions (Addendum)

## 2022-03-13 ENCOUNTER — Encounter (HOSPITAL_COMMUNITY): Payer: Self-pay | Admitting: *Deleted

## 2022-03-13 ENCOUNTER — Emergency Department (HOSPITAL_COMMUNITY)
Admission: EM | Admit: 2022-03-13 | Discharge: 2022-03-13 | Disposition: A | Discharge status: Home / Self Care | Payer: Self-pay | Attending: Emergency Medicine | Admitting: Emergency Medicine

## 2022-03-13 ENCOUNTER — Other Ambulatory Visit: Payer: Self-pay

## 2022-03-13 DIAGNOSIS — R21 Rash and other nonspecific skin eruption: Secondary | ICD-10-CM

## 2022-03-13 DIAGNOSIS — K0889 Other specified disorders of teeth and supporting structures: Secondary | ICD-10-CM | POA: Insufficient documentation

## 2022-03-13 DIAGNOSIS — L509 Urticaria, unspecified: Secondary | ICD-10-CM | POA: Insufficient documentation

## 2022-03-13 DIAGNOSIS — Z79899 Other long term (current) drug therapy: Secondary | ICD-10-CM | POA: Insufficient documentation

## 2022-03-13 DIAGNOSIS — R030 Elevated blood-pressure reading, without diagnosis of hypertension: Secondary | ICD-10-CM | POA: Insufficient documentation

## 2022-03-13 MED ORDER — PREDNISONE 20 MG PO TABS: 20.0000 mg | ORAL_TABLET | Freq: Every day | ORAL | 0 refills | Status: AC

## 2022-03-13 MED ORDER — OXYCODONE-ACETAMINOPHEN 5-325 MG PO TABS
1.0000 | ORAL_TABLET | Freq: Once | ORAL | Status: AC
  Administered 2022-03-13: 1 via ORAL
  Filled 2022-03-13: qty 1

## 2022-03-13 MED ORDER — FAMOTIDINE 20 MG PO TABS: 20.0000 mg | ORAL_TABLET | Freq: Every day | ORAL | 0 refills | Status: DC

## 2022-03-13 MED ORDER — NAPROXEN 500 MG PO TABS: 500.0000 mg | ORAL_TABLET | Freq: Two times a day (BID) | ORAL | 0 refills | Status: DC

## 2022-03-13 MED ORDER — HYDROCODONE-ACETAMINOPHEN 5-325 MG PO TABS
1.0000 | ORAL_TABLET | Freq: Once | ORAL | Status: AC
  Administered 2022-03-13: 1 via ORAL
  Filled 2022-03-13: qty 1

## 2022-03-13 MED ORDER — HYDROCHLOROTHIAZIDE 25 MG PO TABS: 25.0000 mg | ORAL_TABLET | Freq: Every day | ORAL | 0 refills | Status: AC

## 2022-03-13 MED ORDER — DIPHENHYDRAMINE HCL 25 MG PO TABS: 25.0000 mg | ORAL_TABLET | Freq: Four times a day (QID) | ORAL | 0 refills | Status: DC | PRN

## 2022-03-13 MED ORDER — FAMOTIDINE 20 MG PO TABS
20.0000 mg | ORAL_TABLET | Freq: Once | ORAL | Status: AC
  Administered 2022-03-13: 20 mg via ORAL
  Filled 2022-03-13: qty 1

## 2022-03-13 MED ORDER — CETIRIZINE HCL 10 MG PO TABS
5.0000 mg | ORAL_TABLET | Freq: Once | ORAL | Status: AC
  Administered 2022-03-13: 5 mg via ORAL
  Filled 2022-03-13: qty 1

## 2022-03-13 MED ORDER — PREDNISONE 20 MG PO TABS
40.0000 mg | ORAL_TABLET | Freq: Once | ORAL | Status: AC
  Administered 2022-03-13: 40 mg via ORAL
  Filled 2022-03-13: qty 2

## 2022-03-13 MED ORDER — PENICILLIN V POTASSIUM 500 MG PO TABS: 500.0000 mg | ORAL_TABLET | Freq: Four times a day (QID) | ORAL | 0 refills | Status: AC

## 2022-03-13 MED ORDER — HYDROCHLOROTHIAZIDE 25 MG PO TABS
25.0000 mg | ORAL_TABLET | Freq: Every day | ORAL | Status: DC
  Administered 2022-03-13: 25 mg via ORAL
  Filled 2022-03-13: qty 1

## 2022-03-13 NOTE — ED Triage Notes (Addendum)
Pt states toothache in the left upper for about 2 months. Pt says he put a new back on an earring and then started having itching and rash on his ear. Using benadryl cream without relief. Taking ibuprofen without relief.  HTN in triage, denies hx of the same or being on meds

## 2022-03-13 NOTE — ED Provider Notes (Signed)
[facility] EMERGENCY DEPARTMENT Provider Note   CSN: [id number] Arrival date & time: 03/13/22  0222     History  Chief Complaint  Patient presents with   Rash    Chris Greene is a 41 y.o. male with no significant past medical history who presents to the ED due to left upper dental pain and rash.  Patient notes dental pain has been intermittent for the last 2 months.  Last saw a dentist in January or February of this year.  No fever or chills.  No trismus.  No changes to phonation.  Denies difficulties breathing.  He also endorses a rash to his bilateral ears and neck.  Patient notes he lost the back of his earrings and used new backs over the past few days which has caused a rash.  No other new medications or products.  No fever or chills.  No drainage.  Patient endorses some pruritus.  He has been using Benadryl cream without relief.  Denies difficulties breathing.  No nausea, vomiting, or abdominal pain.  Patient found to be hypertensive in triage.  Patient denies history of hypertension however, notes he believes his blood pressure is elevated due to his dental pain.  Denies chest pain, headache, and blurry vision.  History obtained from patient and past medical records. No interpreter used during encounter.       Home Medications Prior to Admission medications   Medication Sig Start Date End Date Taking? Authorizing Provider  diphenhydrAMINE (BENADRYL) 2 % cream Apply 1 Application topically 2 (two) times daily as needed for itching.   Yes [provider]  diphenhydrAMINE (BENADRYL) 25 MG tablet Take 1 tablet (25 mg total) by mouth every 6 (six) hours as needed. 03/13/22  Yes Fynley Chrystal, Merla Riches, PA-C  famotidine (PEPCID) 20 MG tablet Take 1 tablet (20 mg total) by mouth daily for 5 days. 03/13/22 03/18/22 Yes Marina Boerner, Merla Riches, PA-C  hydrochlorothiazide (HYDRODIURIL) 25 MG tablet Take 1 tablet (25 mg total) by mouth daily. 03/13/22  Yes Candise Crabtree,  Jammi Morrissette C, PA-C  ibuprofen (ADVIL) 200 MG tablet Take 400 mg by mouth daily as needed (pain).   Yes [provider]  naproxen (NAPROSYN) 500 MG tablet Take 1 tablet (500 mg total) by mouth 2 (two) times daily. 03/13/22  Yes Kolbee Stallman C, PA-C  penicillin v potassium (VEETID) 500 MG tablet Take 1 tablet (500 mg total) by mouth 4 (four) times daily for 7 days. 03/13/22 03/20/22 Yes Christina Gintz, Merla Riches, PA-C  predniSONE (DELTASONE) 20 MG tablet Take 1 tablet (20 mg total) by mouth daily for 5 days. 03/13/22 03/18/22 Yes Mikena Masoner, Merla Riches, PA-C  HYDROcodone-acetaminophen (NORCO/VICODIN) 5-325 MG tablet Take 1 tablet by mouth every 6 (six) hours as needed for moderate pain or severe pain. Patient not taking: Reported on 03/13/2022 12/26/21   Mardella Layman, MD      Allergies    Bee venom, Shellfish allergy, and Nickel    Review of Systems   Review of Systems  Constitutional:  Negative for chills and fever.  HENT:  Positive for dental problem.   Respiratory:  Negative for shortness of breath.   Cardiovascular:  Negative for chest pain.  Gastrointestinal:  Negative for abdominal pain, nausea and vomiting.  Skin:  Positive for rash.  Neurological:  Negative for weakness and headaches.  All other systems reviewed and are negative.   Physical Exam Updated Vital Signs BP (!) 177/108   Pulse 60   Temp (!) 97.4 F (  36.3 C)   Resp 16   SpO2 98%  Physical Exam Vitals and nursing note reviewed.  Constitutional:      General: He is not in acute distress.    Appearance: He is not ill-appearing.  HENT:     Head: Normocephalic.     Mouth/Throat:     Comments: Numerous dental caries.  Top left posterior molar missing.  No obvious abscess.  No trismus.  No tenderness below tongue.  Tongue in normal position without protrusion.  Normal phonation.  Tolerating oral secretions without difficulty. Eyes:     Pupils: Pupils are equal, round, and reactive to light.  Cardiovascular:     Rate  and Rhythm: Normal rate and regular rhythm.     Pulses: Normal pulses.     Heart sounds: Normal heart sounds. No murmur heard.    No friction rub. No gallop.  Pulmonary:     Effort: Pulmonary effort is normal.     Breath sounds: Normal breath sounds.     Comments: Respirations equal and unlabored, patient able to speak in full sentences, lungs clear to auscultation bilaterally Abdominal:     General: Abdomen is flat. There is no distension.     Palpations: Abdomen is soft.     Tenderness: There is no abdominal tenderness. There is no guarding or rebound.  Musculoskeletal:        General: Normal range of motion.     Cervical back: Neck supple.  Skin:    General: Skin is warm and dry.     Comments: Urticaria to bilateral neck and bilateral ears.  Neurological:     General: No focal deficit present.     Mental Status: He is alert.  Psychiatric:        Mood and Affect: Mood normal.        Behavior: Behavior normal.     ED Results / Procedures / Treatments   Labs (all labs ordered are listed, but only abnormal results are displayed) Labs Reviewed - No data to display  EKG None  Radiology No results found.  Procedures Procedures    Medications Ordered in ED Medications  hydrochlorothiazide (HYDRODIURIL) tablet 25 mg (25 mg Oral Given 03/13/22 1033)  HYDROcodone-acetaminophen (NORCO/VICODIN) 5-325 MG per tablet 1 tablet (1 tablet Oral Given 03/13/22 0306)  famotidine (PEPCID) tablet 20 mg (20 mg Oral Given 03/13/22 0306)  cetirizine (ZYRTEC) tablet 5 mg (5 mg Oral Given 03/13/22 0319)  predniSONE (DELTASONE) tablet 40 mg (40 mg Oral Given 03/13/22 1033)  oxyCODONE-acetaminophen (PERCOCET/ROXICET) 5-325 MG per tablet 1 tablet (1 tablet Oral Given 03/13/22 1032)    ED Course/ Medical Decision Making/ A&P                           Medical Decision Making Amount and/or Complexity of Data Reviewed External Data Reviewed: notes.    Details: previous UC notes  Risk OTC  drugs. Prescription drug management.   This patient presents to the ED for concern of rash, dental pain, HTN this involves an extensive number of treatment options, and is a complaint that carries with it a high risk of complications and morbidity.  The differential diagnosis includes ludwigs, deep space infection, SJS, allergic reaction, hypertensive emergency/urgency  41 year old male presents to the ED due to rash and dental pain.  Rash present for the past few days after using new earrings.  No previous allergic reactions.  Denies difficulties breathing.  He also endorses left  upper dental pain for the past few months.  Upon arrival, patient found to be hypertensive at 220/127.  Patient denies history of hypertension however, through chart review patient's BP has been elevated in the past.  Patient believes his blood pressure is elevated due to his dental pain.  Denies headache, chest pain, and blurry vision.  Low suspicion for hypertensive emergency/urgency.  Suspect underlying untreated hypertension.  Patient given HCTZ here in the ED. Discussed with Dr. Elpidio Anis who felt given low suspicion for HTN emergency since patient is asymptomatic, no labs needed at this time. He is in no acute distress and non-ill-appearing.  Urticaria to bilateral neck and ears.  Patient given Pepcid and Zyrtec in triage.  We will add prednisone.  No other new medications or products. Low suspicion for SJS or other emergent causes of rash. Suspect allergic reaction to earrings.  Airway patent.  Low suspicion for anaphylactic reaction.  Patient also has numerous dental caries.  No obvious abscess.  Low suspicion for Ludwig's or deep space infection. Will treat with antibiotics and pain medication. Patient discharged with dental resources.   Patient discharged with HCTZ. Patient continues to be asymptomatic. Low suspicion for hypertensive urgency/emergency.  BP improved from initial presentation to the ED. considered  hospitalization for hypertension however, given BP had improved since initial evaluation and patient remains asymptomatic, feel patient is stable for discharge with outpatient blood pressure therapy.  [facility] number given to patient at discharge and advised to have BP rechecked in 1 week and to establish care.  Suspect underlying untreated hypertension.  Patient discharged with prednisone, Pepcid, and Benadryl for allergic reaction.  Patient also discharged with naproxen and antibiotics for dental infection. Strict ED precautions discussed with patient. Patient states understanding and agrees to plan. Patient discharged home in no acute distress and stable vitals  Discussed with Dr. Elpidio Anis who agrees with assessment and plan.        Final Clinical Impression(s) / ED Diagnoses Final diagnoses:  Pain, dental  Rash  Elevated blood pressure reading    Rx / DC Orders ED Discharge Orders          Ordered    predniSONE (DELTASONE) 20 MG tablet  Daily        03/13/22 1049    famotidine (PEPCID) 20 MG tablet  Daily        03/13/22 1049    penicillin v potassium (VEETID) 500 MG tablet  4 times daily        03/13/22 1049    naproxen (NAPROSYN) 500 MG tablet  2 times daily        03/13/22 1049    diphenhydrAMINE (BENADRYL) 25 MG tablet  Every 6 hours PRN        03/13/22 1049    hydrochlorothiazide (HYDRODIURIL) 25 MG tablet  Daily        03/13/22 1049              Jesusita Oka 03/13/22 1158    Mardene Sayer, MD 03/13/22 1808

## 2022-03-13 NOTE — Discharge Instructions (Addendum)
It was a pleasure taking care of you today.  As discussed, I am you sending home with antibiotics and pain medication for your dental pain.  I have also included dental resources in the community.  Please call to schedule an appointment for further evaluation.  Your rash is most likely related to an allergic reaction to the new earrings.  I am sending you home with steroids, Pepcid, and Benadryl.  Take for the next 5 days.  Do not use earrings again.  Your blood pressure was elevated while in the ED.  I am sending you home with a blood pressure medication.  Take once daily.  I have included the number of [facility].  Call to schedule an appointment to recheck your blood pressure and to establish care.  Return to the ER for any new or worsening symptoms.

## 2022-03-13 NOTE — ED Provider Triage Note (Signed)
  Emergency Medicine Provider Triage Evaluation Note  MRN:  [id number]  Arrival date & time: 03/13/22    Medically screening exam initiated at 2:56 AM.   CC:   Rash   HPI:  Chris Greene is a 41 y.o. year-old male presents to the ED with chief complaint of contact dermatitis from new earrings as well as dental pain from a broken tooth.  History provided by patient. ROS:  -As included in HPI PE:   Vitals:   03/13/22 0248 03/13/22 0255  BP: (!) 220/127   Pulse: (!) 58 60  Resp: 18   Temp: 97.8 F (36.6 C)   SpO2: 95%     Non-toxic appearing No respiratory distress Contact dermatitis to ears and neck Poor dentition throughout MDM:   Patient was informed that the remainder of the evaluation will be completed by another provider, this initial triage assessment does not replace that evaluation, and the importance of remaining in the ED until their evaluation is complete.    Roxy Horseman, PA-C 03/13/22 [phone]

## 2022-03-13 NOTE — ED Notes (Signed)
Patient educated about not driving or performing other critical tasks (such as operating heavy machinery, caring for infant/toddler/child) due to sedative nature of narcotic medications received while in the ED.  Pt/caregiver verbalized understanding. Pt says he will call his brother to pick him up

## 2023-09-18 ENCOUNTER — Encounter (HOSPITAL_COMMUNITY): Payer: Self-pay

## 2023-09-18 ENCOUNTER — Ambulatory Visit (HOSPITAL_COMMUNITY)
Admission: EM | Admit: 2023-09-18 | Discharge: 2023-09-18 | Disposition: A | Discharge status: Home / Self Care | Payer: Self-pay | Attending: Internal Medicine | Admitting: Internal Medicine

## 2023-09-18 DIAGNOSIS — L209 Atopic dermatitis, unspecified: Secondary | ICD-10-CM

## 2023-09-18 MED ORDER — METHYLPREDNISOLONE 4 MG PO TBPK: ORAL_TABLET | ORAL | 0 refills | Status: AC

## 2023-09-18 NOTE — ED Triage Notes (Signed)
 Patient states he has had a rash to his lower extremities and left side. Patient states she has used Dial antibacterial body wash and Benadryl cream.  Patient states it started after he began washing with a [employer] body wash

## 2023-09-18 NOTE — Discharge Instructions (Signed)
 Follow up with [facility] if you dont get better                        [address]                        [place], [place] [address]  Avoid showering in hot water and too frequently Use Nivea or Cytophil cream after showering and leave some water beads on skin  Go back to suing your prior body wash

## 2023-09-18 NOTE — ED Provider Notes (Signed)
 [facility]    CSN: [id number] Arrival date & time: 09/18/23  1034      History   Chief Complaint Chief Complaint  Patient presents with   Rash    HPI Chris Greene is a 43 y.o. male who presents with rash on lower extremities and  thorax which started a week after using new body wash from [facility]. His skin is dry, and this area has been very itchy. Has been applying alcohol to his legs to help the itching. Has tried applying moisturizers, but has not helped.     History reviewed. No pertinent past medical history.  There are no active problems to display for this patient.   History reviewed. No pertinent surgical history.     Home Medications    Prior to Admission medications   Medication Sig Start Date End Date Taking? Authorizing Provider  methylPREDNISolone (MEDROL DOSEPAK) 4 MG TBPK tablet As directed 09/18/23  Yes Rodriguez-Southworth, Nettie Elm, PA-C  diphenhydrAMINE (BENADRYL) 2 % cream Apply 1 Application topically 2 (two) times daily as needed for itching.    [provider]  hydrochlorothiazide (HYDRODIURIL) 25 MG tablet Take 1 tablet (25 mg total) by mouth daily. 03/13/22   Mannie Stabile, PA-C    Family History History reviewed. No pertinent family history.  Social History Social History   Tobacco Use   Smoking status: Every Day    Types: Cigars   Smokeless tobacco: Never   Tobacco comments:    black and milds  Vaping Use   Vaping status: Never Used  Substance Use Topics   Alcohol use: Yes    Comment: socially   Drug use: Yes    Types: Marijuana     Allergies   Bee venom, Shellfish allergy, and Nickel   Review of Systems Review of Systems As noted in HPI  Physical Exam Triage Vital Signs ED Triage Vitals  Encounter Vitals Group     BP 09/18/23 1152 (!) 163/93     Systolic BP Percentile --      Diastolic BP Percentile --      Pulse Rate 09/18/23 1152 70     Resp 09/18/23 1152 16     Temp 09/18/23  1152 97.6 F (36.4 C)     Temp Source 09/18/23 1152 Oral     SpO2 09/18/23 1152 97 %     Weight --      Height --      Head Circumference --      Peak Flow --      Pain Score 09/18/23 1155 10     Pain Loc --      Pain Education --      Exclude from Growth Chart --    No data found.  Updated Vital Signs BP (!) 163/93 (BP Location: Left Arm)   Pulse 70   Temp 97.6 F (36.4 C) (Oral)   Resp 16   SpO2 97%   Visual Acuity Right Eye Distance:   Left Eye Distance:   Bilateral Distance:    Right Eye Near:   Left Eye Near:    Bilateral Near:     Physical Exam Vitals and nursing note reviewed.  Constitutional:      General: He is not in acute distress.    Appearance: He is not toxic-appearing.  HENT:     Right Ear: External ear normal.     Left Ear: External ear normal.  Eyes:     General: No scleral  icterus.    Conjunctiva/sclera: Conjunctivae normal.  Pulmonary:     Effort: Pulmonary effort is normal.  Musculoskeletal:        General: Normal range of motion.  Skin:    General: Skin is warm and dry.     Findings: Rash present.     Comments: Has fine skin color papules on forearms on sides of his thorax. Has one oval dry patch on R thorax close to abdomen. Lower legs show multiple oval to circular dry patches, and thick dry skin on R calf with few papules. The rash is not painful. Has hyperpigmentation on R calf.   Neurological:     Mental Status: He is alert and oriented to person, place, and time.     Gait: Gait normal.  Psychiatric:        Mood and Affect: Mood normal.        Behavior: Behavior normal.        Thought Content: Thought content normal.        Judgment: Judgment normal.      UC Treatments / Results  Labs (all labs ordered are listed, but only abnormal results are displayed) Labs Reviewed - No data to display  EKG   Radiology No results found.  Procedures Procedures (including critical care time)  Medications Ordered in UC Medications  - No data to display  Initial Impression / Assessment and Plan / UC Course  I have reviewed the triage vital signs and the nursing notes.  Atopic dermatitis/Eczema  I placed him on Medrol dose pack as noted and educated how to moisturize his skin Also needs to go back to prior body wash.  Final Clinical Impressions(s) / UC Diagnoses   Final diagnoses:  Atopic dermatitis, unspecified type     Discharge Instructions      Follow up with [facility] if you dont get better                        [address]                        [place], [place] [address]  Avoid showering in hot water and too frequently Use Nivea or Cytophil cream after showering and leave some water beads on skin  Go back to suing your prior body wash     ED Prescriptions     Medication Sig Dispense Auth. Provider   methylPREDNISolone (MEDROL DOSEPAK) 4 MG TBPK tablet As directed 21 tablet Rodriguez-Southworth, Nettie Elm, PA-C      PDMP not reviewed this encounter.   Garey Ham, PA-C 09/18/23 1309
